# Patient Record
Sex: Female | Born: 1955 | Race: White | Hispanic: No | Marital: Single | Smoking: Never smoker
Health system: Southern US, Community
[De-identification: ages and names within clinical notes are randomized; demographics above are authoritative.]

## PROBLEM LIST (undated history)

## (undated) DIAGNOSIS — I10 Essential (primary) hypertension: Secondary | ICD-10-CM

## (undated) DIAGNOSIS — M810 Age-related osteoporosis without current pathological fracture: Secondary | ICD-10-CM

## (undated) DIAGNOSIS — T7840XA Allergy, unspecified, initial encounter: Secondary | ICD-10-CM

## (undated) DIAGNOSIS — T783XXA Angioneurotic edema, initial encounter: Secondary | ICD-10-CM

## (undated) DIAGNOSIS — IMO0002 Reserved for concepts with insufficient information to code with codable children: Secondary | ICD-10-CM

## (undated) DIAGNOSIS — M1990 Unspecified osteoarthritis, unspecified site: Secondary | ICD-10-CM

## (undated) DIAGNOSIS — M549 Dorsalgia, unspecified: Principal | ICD-10-CM

## (undated) DIAGNOSIS — E119 Type 2 diabetes mellitus without complications: Principal | ICD-10-CM

## (undated) DIAGNOSIS — G8929 Other chronic pain: Principal | ICD-10-CM

## (undated) HISTORY — PX: FRACTURE SURGERY: SHX138

## (undated) HISTORY — DX: Age-related osteoporosis without current pathological fracture: M81.0

## (undated) HISTORY — DX: Essential (primary) hypertension: I10

## (undated) HISTORY — DX: Dorsalgia, unspecified: M54.9

## (undated) HISTORY — PX: NOSE SURGERY: SHX723

## (undated) HISTORY — DX: Reserved for concepts with insufficient information to code with codable children: IMO0002

## (undated) HISTORY — DX: Allergy, unspecified, initial encounter: T78.40XA

## (undated) HISTORY — PX: CERVICAL SPINE SURGERY: SHX589

## (undated) HISTORY — PX: BACK SURGERY: SHX140

## (undated) HISTORY — DX: Angioneurotic edema, initial encounter: T78.3XXA

## (undated) HISTORY — PX: SPINE SURGERY: SHX786

## (undated) HISTORY — DX: Other chronic pain: G89.29

## (undated) HISTORY — DX: Unspecified osteoarthritis, unspecified site: M19.90

## (undated) HISTORY — DX: Type 2 diabetes mellitus without complications: E11.9

## (undated) HISTORY — PX: OVARIAN CYST SURGERY: SHX726

---

## 2004-04-25 ENCOUNTER — Emergency Department (HOSPITAL_COMMUNITY): Admission: EM | Admit: 2004-04-25 | Discharge: 2004-04-25 | Payer: Self-pay | Admitting: Emergency Medicine

## 2012-05-23 ENCOUNTER — Emergency Department: Payer: Self-pay | Admitting: Emergency Medicine

## 2012-08-07 ENCOUNTER — Ambulatory Visit: Payer: Self-pay | Admitting: Physician Assistant

## 2012-08-07 ENCOUNTER — Ambulatory Visit: Payer: Self-pay | Admitting: Family Medicine

## 2012-08-16 ENCOUNTER — Ambulatory Visit (INDEPENDENT_AMBULATORY_CARE_PROVIDER_SITE_OTHER): Payer: PRIVATE HEALTH INSURANCE | Admitting: Family Medicine

## 2012-08-16 ENCOUNTER — Ambulatory Visit
Admission: RE | Admit: 2012-08-16 | Discharge: 2012-08-16 | Disposition: A | Discharge status: Home / Self Care | Payer: PRIVATE HEALTH INSURANCE | Source: Ambulatory Visit | Attending: Family Medicine | Admitting: Family Medicine

## 2012-08-16 ENCOUNTER — Ambulatory Visit (INDEPENDENT_AMBULATORY_CARE_PROVIDER_SITE_OTHER)
Admission: RE | Admit: 2012-08-16 | Discharge: 2012-08-16 | Disposition: A | Discharge status: Home / Self Care | Payer: PRIVATE HEALTH INSURANCE | Source: Ambulatory Visit | Attending: Family Medicine | Admitting: Family Medicine

## 2012-08-16 ENCOUNTER — Encounter: Payer: Self-pay | Admitting: Family Medicine

## 2012-08-16 VITALS — BP 130/82 | HR 80 | Temp 98.4°F

## 2012-08-16 DIAGNOSIS — R209 Unspecified disturbances of skin sensation: Secondary | ICD-10-CM

## 2012-08-16 DIAGNOSIS — M79644 Pain in right finger(s): Secondary | ICD-10-CM

## 2012-08-16 DIAGNOSIS — M2550 Pain in unspecified joint: Secondary | ICD-10-CM

## 2012-08-16 DIAGNOSIS — M549 Dorsalgia, unspecified: Secondary | ICD-10-CM

## 2012-08-16 DIAGNOSIS — M79609 Pain in unspecified limb: Secondary | ICD-10-CM

## 2012-08-16 DIAGNOSIS — R200 Anesthesia of skin: Secondary | ICD-10-CM

## 2012-08-16 NOTE — Progress Notes (Signed)
Nature conservation officer at [facility] [address] [place] [address] Phone: [phone] Fax: [phone]  Date:  08/16/2012   Name:  Summer Brown   DOB:  06-28-1955   MRN:  [id number] Gender: female Age: 57 y.o.  Primary Physician:  Hannah Beat, MD  Evaluating MD: Hannah Beat, MD   Chief Complaint: Establish Care   History of Present Illness:  Summer Brown is a 57 y.o. pleasant patient who presents with the following:  New patient presents with multiple complaints including neck pain, hand swelling, polyarthralgia, R sided median nerve distribution numbness in the UE. She also has concern about potential fracture to the R 5th digit.   Fingers are swollen. No synovitis. She relates this to doing repetitive things at work.  R sided medial nerve distribution numbness She reports having had EMG/NCV that were normal > 20 years ago. 3 months ago, maybe 4, numbness started.  Back - she also describes a recent back injury at work, and she is being followed by her worker's comp provider. Most recently given flexeril and a medrol dose-pak. L > R toes - affected. Numbness.  Reports that slipped and fell   R 5th finger: trauma in the last week, now pain and swelling mostly along the shaft. No bruising.   B radiculopathy Legs: intermittent, no prior spine surgery.  Repetitive motion activities at work.  There are no active problems to display for this patient.   Past Medical History  Diagnosis Date  . Ulcer     No past surgical history on file.  History   Social History  . Marital Status: Divorced    Spouse Name: N/A    Number of Children: N/A  . Years of Education: N/A   Occupational History  .  Other    [employer]   Social History Main Topics  . Smoking status: Never Smoker   . Smokeless tobacco: Not on file  . Alcohol Use: Not on file  . Drug Use: Not on file  . Sexually Active: Not on file   Other Topics Concern  . 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  Allergies  Allergen Reactions  . Codeine Itching    No current outpatient prescriptions on file prior to visit.   No current facility-administered medications on file prior to visit.     Review of Systems:  Multiple neuro complaints as above, thinking clearly, no chest pain, shortness of breath, fever. MSK as above. Otherwise, the pertinent positives and negatives are listed above and in the HPI, otherwise a full review of systems has been reviewed and is negative unless noted positive.   Physical Examination: BP 130/82  Pulse 80  Temp(Src) 98.4 F (36.9 C) (Oral)  SpO2 96%  Ideal Body Weight:     GEN: Well-developed,well-nourished,in no acute distress; alert,appropriate and cooperative throughout examination HEENT: Normocephalic and atraumatic without obvious abnormalities. Ears, externally no deformities CV: rrr, no m/g/r PULM: Breathing comfortably in no respiratory distress EXT: No clubbing, cyanosis, or edema PSYCH: Normally interactive. Cooperative during the interview. Pleasant. Friendly and conversant. Not anxious or depressed appearing. Normal, full affect.  CERVICAL SPINE EXAM Range of motion: Flexion, extension, lateral bending, and rotation: fairly good, lacks 5 deg Pain with terminal motion: yes Spinous Processes: NT SCM: NT Upper paracervical muscles: mild tenderness Upper traps: mild tenderness C5-T1 intact, sensation and motor  GRIP REDUCED AT THE R COMPARED TO L  R hand Ecchymosis or edema: neg ROM wrist/hand/digits: restricted at 5 Carpals, MCP's, digits: no significant synovitis, mildly tender along tendon sheaths. 5th ttp between MCP and PIP on  shaft Distal Ulna and Radius: NT Ecchymosis or edema: neg No instability Cysts/nodules: neg Digit triggering: neg Finkelstein's test: neg Snuffbox tenderness: neg Scaphoid tubercle: NT Full  composite fist, no malrotation Grip, all digits: 5/5 str DIPJT: NT PIP JT: NT MCP JT: NT No tenosynovitis Axial load test: neg Phalen's and tinel's: baseline tingling and numb Atrophy: neg  Hand sensation: intact   Dg Finger Little Right  08/16/2012   *RADIOLOGY REPORT*  Clinical Data: right little finger pain, trauma  RIGHT LITTLE FINGER 2+V  Comparison: None.  Findings: No fracture or dislocation.  Soft tissue swelling around PIP joint.  IMPRESSION: Soft tissue swelling, no fracture.   Original Report Authenticated By: Esperanza Heir, M.D.    Assessment and Plan:  Polyarthralgia - Plan: ANA, Cyclic citrul peptide antibody, IgG, Rheumatoid factor, Sedimentation rate, High sensitivity CRP, CBC with Differential: potential systemic rheum disorder is possible here with multitude of MSK complaints.  Medrol-dosepak may make finger discomfort improve.  Finger pain, right - Plan: DG Finger Little Right, CANCELED: DG Finger Index Right: no fx, reassured.  Numbness and tingling in right hand - Plan: NCV with EMG(electromyography): nerve entrapment with decreased grip and median nerve distribution. Question if is true CTS or entrapped higher in arm or approaching neck.  Backache: defer to w/c provider.  Orders Today:  Orders Placed This Encounter  Procedures  . DG Finger Little Right    Standing Status: Future     Number of Occurrences: 1     Standing Expiration Date: 10/17/2013    Order Specific Question:  Reason for Exam (SYMPTOM  OR DIAGNOSIS REQUIRED)    Answer:  trauma    Order Specific Question:  Is the patient pregnant?    Answer:  No    Order Specific Question:  Preferred imaging location?    Answer:  [facility]  . ANA  . Cyclic citrul peptide antibody, IgG  . Rheumatoid factor  . Sedimentation rate  . High sensitivity CRP  . CBC with Differential  . Anti-nuclear ab-titer (ANA titer)  . NCV with EMG(electromyography)    Standing Status: Future     Number of  Occurrences:      Standing Expiration Date: 08/16/2013    Updated Medication List: (Includes new medications, updates to list, dose adjustments) Meds ordered this encounter  Medications  . cyclobenzaprine (FLEXERIL) 10 MG tablet    Sig: Take 10 mg by mouth 3 (three) times daily as needed for muscle spasms.  Marland Kitchen ibuprofen (ADVIL,MOTRIN) 600 MG tablet    Sig: Take 600 mg by mouth every 6 (six) hours as needed for pain.  . methocarbamol (ROBAXIN) 500 MG tablet    Sig: Take 1,000 mg by mouth 4 (four) times daily.    Medications Discontinued: There are no discontinued medications.    Signed, Elpidio Galea. Tniya Bowditch, MD 08/16/2012 2:24 PM

## 2012-08-16 NOTE — Patient Instructions (Addendum)
REFERRAL: GO THE THE FRONT ROOM AT THE ENTRANCE OF OUR CLINIC, NEAR CHECK IN. ASK FOR MARION. SHE WILL HELP YOU SET UP YOUR REFERRAL. DATE: TIME:  

## 2012-08-17 LAB — CBC WITH DIFFERENTIAL/PLATELET
Basophils Relative: 1.2 % (ref 0.0–3.0)
Eosinophils Relative: 4.8 % (ref 0.0–5.0)
Lymphocytes Relative: 35.9 % (ref 12.0–46.0)
Monocytes Relative: 8.1 % (ref 3.0–12.0)
Neutrophils Relative %: 50 % (ref 43.0–77.0)
Platelets: 239 10*3/uL (ref 150.0–400.0)
RBC: 4.43 Mil/uL (ref 3.87–5.11)
WBC: 7.1 10*3/uL (ref 4.5–10.5)

## 2012-08-17 LAB — ANTI-NUCLEAR AB-TITER (ANA TITER): ANA Titer 1: 1:320 {titer} — ABNORMAL HIGH

## 2012-08-17 LAB — ANA: Anti Nuclear Antibody(ANA): POSITIVE — AB

## 2012-08-17 LAB — HIGH SENSITIVITY CRP: CRP, High Sensitivity: 4.21 mg/L (ref 0.000–5.000)

## 2012-08-17 LAB — RHEUMATOID FACTOR: Rhuematoid fact SerPl-aCnc: <10 IU/mL (ref <=14)

## 2012-08-20 ENCOUNTER — Encounter: Payer: Self-pay | Admitting: Family Medicine

## 2012-08-23 ENCOUNTER — Encounter: Payer: Self-pay | Admitting: *Deleted

## 2012-09-21 ENCOUNTER — Ambulatory Visit: Payer: Self-pay | Admitting: Orthopedic Surgery

## 2012-10-02 ENCOUNTER — Telehealth: Payer: Self-pay

## 2012-10-02 NOTE — Telephone Encounter (Signed)
Pt said she had gotten letter several weeks ago about lab results and pt wants to know what she owes for labs. Pt will call [facility] for billing [phone]. Pt said she had recently had carpal tunnel surgery last week.

## 2012-10-03 ENCOUNTER — Telehealth: Payer: Self-pay

## 2012-10-03 NOTE — Telephone Encounter (Signed)
Pt received lab results with Dr Copland's comments in the mail; pt request more detail about the one test that was not normal and what Dr Patsy Lager was looking for in the labs that were ordered. Pt request cb on 10/04/12.

## 2012-10-04 NOTE — Telephone Encounter (Signed)
Discussed all with her.

## 2012-10-13 ENCOUNTER — Telehealth: Payer: Self-pay

## 2012-10-13 NOTE — Telephone Encounter (Signed)
Summer Brown with [facility] cking status of med records request faxed 10/04/12. Advised [facility] picked up on 10/10/12; once picked up has 7-10 day response time.

## 2012-10-25 ENCOUNTER — Telehealth: Payer: Self-pay

## 2012-10-25 DIAGNOSIS — M5416 Radiculopathy, lumbar region: Secondary | ICD-10-CM

## 2012-10-25 NOTE — Telephone Encounter (Signed)
Pt was seen 08/16/2012; pt still having problems with numbness in rt leg and on and off has sharpe pain in rt leg. Pt had worker comp injury in 07/2012 and has seen orthopedic who did MRI (pt has MRI disc). Pt wants referral to neurologist because ortho has no idea why pt has numbness and pain in rt leg. Pt does not want shot in her back. Pt spoke with Efraim Kaufmann, workers comp rep and pt was advised can get 2nd opinion herself or worker's comp can schedule. Pt request our office do a neurology referral. Pt's workers comp rep Gaffer # is [phone]. Pt request cb.

## 2012-10-25 NOTE — Telephone Encounter (Signed)
I would suggest that she a neurosurgeon who specializes in spine surgery and evaluation. They are the experts in that field.  I will put in consult for one of the best in our area.    Hannah Beat, MD 10/25/2012, 5:35 PM

## 2012-10-26 ENCOUNTER — Telehealth: Payer: Self-pay | Admitting: Family Medicine

## 2012-10-26 NOTE — Telephone Encounter (Signed)
Patient notified as instructed.  Advised Shirlee Limerick or Bonita Quin would be calling her with the appointment. Neysa states the referral will need to be called to her worker's comp rep Melissa at [phone].

## 2012-10-26 NOTE — Telephone Encounter (Signed)
Called [facility] and spoke to their [facility] person, Tacey Ruiz. She told me what had to happen to get the pt seen there. They needed the Ortho notes and MRI before accepting her and approval from [facility]. After sending your note over and referral I called  the pt to let her know. She called the [facility] person and they told her that she could Not see a Neurosurgeon that they were sending her to another Ortho in [place]. She asked [place] to stop this referral at this time and would call back if she wanted it again. I am cancelling this referral.

## 2012-10-27 NOTE — Telephone Encounter (Signed)
OK 

## 2013-06-28 ENCOUNTER — Ambulatory Visit (INDEPENDENT_AMBULATORY_CARE_PROVIDER_SITE_OTHER): Payer: Managed Care, Other (non HMO) | Admitting: Family Medicine

## 2013-06-28 ENCOUNTER — Other Ambulatory Visit (HOSPITAL_COMMUNITY)
Admission: RE | Admit: 2013-06-28 | Discharge: 2013-06-28 | Disposition: A | Discharge status: Home / Self Care | Payer: Managed Care, Other (non HMO) | Source: Ambulatory Visit | Attending: Family Medicine | Admitting: Family Medicine

## 2013-06-28 ENCOUNTER — Encounter: Payer: Self-pay | Admitting: Family Medicine

## 2013-06-28 VITALS — BP 130/84 | HR 86 | Temp 98.4°F | Ht 67.5 in | Wt 296.8 lb

## 2013-06-28 DIAGNOSIS — Z1151 Encounter for screening for human papillomavirus (HPV): Secondary | ICD-10-CM | POA: Insufficient documentation

## 2013-06-28 DIAGNOSIS — Z124 Encounter for screening for malignant neoplasm of cervix: Secondary | ICD-10-CM

## 2013-06-28 DIAGNOSIS — R635 Abnormal weight gain: Secondary | ICD-10-CM

## 2013-06-28 DIAGNOSIS — Z0000 Encounter for general adult medical examination without abnormal findings: Secondary | ICD-10-CM

## 2013-06-28 DIAGNOSIS — R5381 Other malaise: Secondary | ICD-10-CM

## 2013-06-28 DIAGNOSIS — R5383 Other fatigue: Secondary | ICD-10-CM

## 2013-06-28 DIAGNOSIS — Z01419 Encounter for gynecological examination (general) (routine) without abnormal findings: Secondary | ICD-10-CM | POA: Insufficient documentation

## 2013-06-28 DIAGNOSIS — M2550 Pain in unspecified joint: Secondary | ICD-10-CM

## 2013-06-28 NOTE — Progress Notes (Signed)
[place] [address] Phone: [phone] Fax: [phone]  Patient ID: Summer Brown MRN: [id number], DOB: [birth year], 58 y.o. Date of Encounter: 06/28/2013  Primary Physician:  Owens Loffler, MD   Chief Complaint: Annual Exam  Subjective:   History of Present Illness:  Summer Brown is a 58 y.o. pleasant patient who presents with the following:  Health Maintenance Summary Reviewed and updated, unless pt declines services and some acute concerns.  Tobacco History Reviewed. Non-smoker Alcohol: No concerns, no excessive use Exercise Habits: minimal to no activity STD concerns: none Drug Use: None Birth control method: n/a Menses regular: n/a Lumps or breast concerns: no Breast Cancer Family History: no  Mammo: she will set up. Colon: declines  Having some urinary leakage.  Weight gain.  Stopped period about a year ago.   Almost no alcohol.  Restrictions from back injury.   He fatigue and generally not feeling a little well. The patient reports she has had about 100 pounds of weight gain compared to her last pregnancy weight. She is very upset about this, she thinks that this may be related to some hormonal dysfunction, and she is minimally able to exercise right now due to some restrictions with her back. She thinks that she is following a relatively healthy diet, but she is still unable to lose weight in any way.   Health Maintenance  Topic Date Due  . Mammogram  07/24/2004  . Colonoscopy  07/01/2018 (Originally 07/24/2004)  . Tetanus/tdap  07/01/2018 (Originally 07/24/1973)  . Influenza Vaccine  08/18/2013  . Pap Smear  06/28/2016      There is no immunization history on file for this patient.  There are no active problems to display for this patient.  Past Medical History  Diagnosis Date  . Ulcer    No past surgical history on file. History   Social History  . Marital Status: Divorced    Spouse Name: N/A    Number of Children:  N/A  . Years of Education: N/A   Occupational History  .  Other    [employer]   Social History Main Topics  . Smoking status: Never Smoker   . Smokeless tobacco: Never Used  . Alcohol Use: Yes     Comment: rare  . Drug Use: No  . Sexual Activity: Not on file   Other Topics Concern  . 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 Allergies  Allergen Reactions  . Codeine Itching    Medication list has been reviewed and updated.  Review of Systems:   General: Denies fever, chills, sweats. No significant weight loss. Eyes: Denies blurring,significant itching ENT: Denies earache, sore throat, and hoarseness.  Cardiovascular: Denies chest pains, palpitations, dyspnea on exertion,  Respiratory: Denies cough, dyspnea at rest,wheeezing Breast: no concerns about lumps GI: Denies nausea, vomiting, diarrhea, constipation, change in bowel habits, abdominal pain, melena, hematochezia GU: Denies dysuria, hematuria, urinary hesitancy, nocturia, denies STD risk, no concerns about discharge Musculoskeletal: ONGOING BACK PAIN. Multiple interventions including epidural steroid injections and other procedures or treatments have been tried. She is being seen and followed by neurosurgery. They have recommended at this point consideration of operative fixation. Derm: Denies rash, itching Neuro: Denies  paresthesias, frequent falls, frequent headaches Psych: Denies depression, anxiety Endocrine: Denies cold intolerance, heat intolerance, polydipsia Heme: Denies enlarged lymph nodes Allergy: No hayfever  Objective:  Physical Examination: BP 130/84  Pulse 86  Temp(Src) 98.4 F (36.9 C) (Oral)  Ht 5' 7.5" (1.715 m)  Wt 296 lb 12 oz (134.605 kg)  BMI 45.76 kg/m2  No exam data present GEN: well developed, well nourished, no acute distress Eyes: conjunctiva and lids normal, PERRLA, EOMI ENT: TM clear, nares clear, oral  exam WNL Neck: supple, no lymphadenopathy, no thyromegaly, no JVD Pulm: clear to auscultation and percussion, respiratory effort normal CV: regular rate and rhythm, S1-S2, no murmur, rub or gallop, no bruits Chest: no scars, masses, no lumps BREAST: no lumps, no axillary LAD, no nipple discharge GI: soft, non-tender; no hepatosplenomegaly, masses; active bowel sounds all quadrants GU: Normal external female genitalia. Cervix appears intact without lesions or irritation. Vaginal canal normal without ulceration or lesion. Cervix NT to exam. Ovaries neither enlarged nor tender. (Chaperoned examination by female staff) This portion of the physical examination was chaperoned by Hedy Camara, [place].  Lymph: no cervical, axillary or inguinal adenopathy MSK: gait normal. SKIN: clear, good turgor, color WNL, no rashes, lesions, or ulcerations Neuro: normal mental status, normal strength, sensation, and motion Psych: alert; oriented to person, place and time, normally interactive and not anxious or depressed in appearance.  All labs reviewed with patient. Post-visit. Lipids: No results found for this basename: chol, trig, hdl, ldl, ldldirect, vldl, cholhdl   CBC: CBC Latest Ref Rng 06/28/2013 08/16/2012  WBC 4.0 - 10.5 K/uL 7.6 7.1  Hemoglobin 12.0 - 15.0 g/dL 15.1(H) 14.2  Hematocrit 36.0 - 46.0 % 44.7 41.4  Platelets 150.0 - 400.0 K/uL 286.0 037.0    Basic Metabolic Panel:    Component Value Date/Time   NA 138 06/28/2013 1554   K 4.0 06/28/2013 1554   CL 103 06/28/2013 1554   CO2 26 06/28/2013 1554   BUN 16 06/28/2013 1554   CREATININE 0.7 06/28/2013 1554   GLUCOSE 101* 06/28/2013 1554   CALCIUM 9.4 06/28/2013 1554   Hepatic Function Latest Ref Rng 06/28/2013  Total Protein 6.0 - 8.3 g/dL 7.5  Albumin 3.5 - 5.2 g/dL 4.0  AST 0 - 37 U/L 23  ALT 0 - 35 U/L 28  Alk Phosphatase 39 - 117 U/L 58  Total Bilirubin 0.2 - 1.2 mg/dL 0.4  Bilirubin, Direct 0.0 - 0.3 mg/dL 0.1    Lab Results    Component Value Date   TSH 2.27 06/28/2013    No results found.  Assessment & Plan:   Routine general medical examination at a health care facility  Other malaise and fatigue - Plan: Basic metabolic panel, CBC with Differential, Hepatic function panel, TSH  Weight gain - Plan: TSH  Screening for malignant neoplasm of the cervix - Plan: Cytology - PAP [place]  Health Maintenance Exam: The patient's preventative maintenance and recommended screening tests for an annual wellness exam were reviewed in full today. Brought up to date unless services declined.  Counselled on the importance of diet, exercise, and its role in overall health and mortality. The patient's FH and SH was reviewed, including their home life, tobacco status, and drug and alcohol status.  Refuses colonoscopy. The patient declines routine health maintenance services noted. We reviewed that could lead to missing significant problems that could affect there mortality. The patient indicated that they understood this and was willing to accept those risks.   Weight gain and fatigue: >15 minutes spent in face to face time with patient, >50% spent in counselling or coordination of care: we spent 100 percent of this  time discussing diet, exercise, weight loss, plans dictated influence weight loss, or runs it may influence weight loss, and tied to diet she could be followed. We also discussed some potential medications, but I did not think that there are appropriate in his case. It all of these things, potential benefits has to be weighed versus potential risks. The patient expressed ongoing frustration with her inability to lose weight and not doing all that well.  Follow-up: No Follow-up on file. Or follow-up in 1 year for complete physical examination  New Prescriptions   No medications on file   Modified Medications   No medications on file   Orders Placed This Encounter  Procedures  . Basic metabolic panel  .  CBC with Differential  . Hepatic function panel  . TSH    Signed,  Frederico Hamman T. Vian Fluegel, MD, CAQ Sports Medicine   Discontinued Medications   CYCLOBENZAPRINE (FLEXERIL) 10 MG TABLET    Take 10 mg by mouth 3 (three) times daily as needed for muscle spasms.   METHOCARBAMOL (ROBAXIN) 500 MG TABLET    Take 1,000 mg by mouth 4 (four) times daily.   Current Medications at Discharge:   Medication List       This list is accurate as of: 06/28/13 11:59 PM.  Always use your most recent med list.               ibuprofen 600 MG tablet  Commonly known as:  ADVIL,MOTRIN  Take 600 mg by mouth every 6 (six) hours as needed for pain.

## 2013-06-28 NOTE — Progress Notes (Signed)
Pre visit review using our clinic review tool, if applicable. No additional management support is needed unless otherwise documented below in the visit note. 

## 2013-06-29 ENCOUNTER — Encounter: Payer: Self-pay | Admitting: *Deleted

## 2013-06-29 LAB — BASIC METABOLIC PANEL
BUN: 16 mg/dL (ref 6–23)
CALCIUM: 9.4 mg/dL (ref 8.4–10.5)
CO2: 26 meq/L (ref 19–32)
Chloride: 103 mEq/L (ref 96–112)
Creatinine, Ser: 0.7 mg/dL (ref 0.4–1.2)
GFR: 88.14 mL/min (ref >60.00)
GLUCOSE: 101 mg/dL — AB (ref 70–99)
Potassium: 4 mEq/L (ref 3.5–5.1)
Sodium: 138 mEq/L (ref 135–145)

## 2013-06-29 LAB — CBC WITH DIFFERENTIAL/PLATELET
BASOS ABS: 0.1 10*3/uL (ref 0.0–0.1)
Basophils Relative: 1.2 % (ref 0.0–3.0)
EOS ABS: 0.2 10*3/uL (ref 0.0–0.7)
Eosinophils Relative: 2.7 % (ref 0.0–5.0)
HCT: 44.7 % (ref 36.0–46.0)
Hemoglobin: 15.1 g/dL — ABNORMAL HIGH (ref 12.0–15.0)
LYMPHS ABS: 2.8 10*3/uL (ref 0.7–4.0)
LYMPHS PCT: 37.2 % (ref 12.0–46.0)
MCHC: 33.9 g/dL (ref 30.0–36.0)
MCV: 91.9 fl (ref 78.0–100.0)
MONOS PCT: 9.2 % (ref 3.0–12.0)
Monocytes Absolute: 0.7 10*3/uL (ref 0.1–1.0)
Neutro Abs: 3.8 10*3/uL (ref 1.4–7.7)
Neutrophils Relative %: 49.7 % (ref 43.0–77.0)
PLATELETS: 286 10*3/uL (ref 150.0–400.0)
RBC: 4.87 Mil/uL (ref 3.87–5.11)
RDW: 13.6 % (ref 11.5–15.5)
WBC: 7.6 10*3/uL (ref 4.0–10.5)

## 2013-06-29 LAB — HEPATIC FUNCTION PANEL
ALK PHOS: 58 U/L (ref 39–117)
ALT: 28 U/L (ref 0–35)
AST: 23 U/L (ref 0–37)
Albumin: 4 g/dL (ref 3.5–5.2)
BILIRUBIN TOTAL: 0.4 mg/dL (ref 0.2–1.2)
Bilirubin, Direct: 0.1 mg/dL (ref 0.0–0.3)
Total Protein: 7.5 g/dL (ref 6.0–8.3)

## 2013-06-29 LAB — TSH: TSH: 2.27 u[IU]/mL (ref 0.35–4.50)

## 2013-07-02 ENCOUNTER — Other Ambulatory Visit (INDEPENDENT_AMBULATORY_CARE_PROVIDER_SITE_OTHER): Payer: Managed Care, Other (non HMO)

## 2013-07-02 DIAGNOSIS — Z13220 Encounter for screening for lipoid disorders: Secondary | ICD-10-CM

## 2013-07-02 LAB — LIPID PANEL
CHOL/HDL RATIO: 4
Cholesterol: 166 mg/dL (ref 0–200)
HDL: 41.2 mg/dL (ref >39.00)
LDL Cholesterol: 109 mg/dL — ABNORMAL HIGH (ref 0–99)
NONHDL: 124.8
Triglycerides: 78 mg/dL (ref 0.0–149.0)
VLDL: 15.6 mg/dL (ref 0.0–40.0)

## 2013-07-03 ENCOUNTER — Encounter: Payer: Self-pay | Admitting: *Deleted

## 2013-07-03 LAB — CYTOLOGY - PAP

## 2013-07-04 ENCOUNTER — Telehealth: Payer: Self-pay

## 2013-07-04 ENCOUNTER — Encounter: Payer: Self-pay | Admitting: *Deleted

## 2013-07-04 NOTE — Telephone Encounter (Signed)
Call:  Please read what I said last year.  I did not repeat rheumatological labs again this year - this is not done typically with routine health maintenance. Electronically Signed  By: Owens Loffler, MD On: 07/04/2013 5:03 PM    "Notes Recorded by Ricki Miller, CMA on 08/23/2012 at 9:48 AM Mailed letter and copy of labs. ------  Notes Recorded by Owens Loffler, MD on 08/22/2012 at 8:32 PM Letter and copy of labs:  Basically, all labs look ok. 1 of them came back abnormal, but when looking at the overall picture with 2 other important markers, it would be highly, highly unusual for this to go along with systemic rheumatological problems.  That does not mean that you do not have significant symptoms and pain - because you do. Hopefully, some of the treatment for your back will help other joints.   Owens Loffler, MD 08/22/2012, 8:32 PM"

## 2013-07-04 NOTE — Telephone Encounter (Signed)
Pt said last year Dr Lorelei Pont mentioned one of labs indicated possible lupus and pt wants to know if testing this year indicated any problem with lupus.pt request cb.

## 2013-07-09 DIAGNOSIS — M2550 Pain in unspecified joint: Secondary | ICD-10-CM | POA: Insufficient documentation

## 2013-07-09 NOTE — Telephone Encounter (Signed)
Please set up draw. Extensive future orders placed for the patient.   Thanks!  Electronically Signed  By: Owens Loffler, MD On: 07/09/2013 6:01 PM

## 2013-07-09 NOTE — Addendum Note (Signed)
Addended by: Owens Loffler on: 07/09/2013 06:01 PM   Modules accepted: Orders

## 2013-07-09 NOTE — Telephone Encounter (Signed)
Spoke with Summer Brown and advised Dr. Lorelei Pont did not repeat the rheumatology labs because those are not normal labs ordered for routine health maintenance and according to last years note that Dr. Lorelei Pont felt is was highly, highly unusual for this to be a systemic rheumatological problem. Patient states she would feel better if Dr. Lorelei Pont would repeat the labs so if something is wrong it can be followed up on.  Advised I would send a note to Dr. Lorelei Pont.

## 2013-07-10 NOTE — Telephone Encounter (Signed)
Patient scheduled.

## 2013-07-11 ENCOUNTER — Other Ambulatory Visit (INDEPENDENT_AMBULATORY_CARE_PROVIDER_SITE_OTHER): Payer: Managed Care, Other (non HMO)

## 2013-07-11 ENCOUNTER — Other Ambulatory Visit: Payer: Self-pay | Admitting: Family Medicine

## 2013-07-11 DIAGNOSIS — R5383 Other fatigue: Principal | ICD-10-CM

## 2013-07-11 DIAGNOSIS — R5381 Other malaise: Secondary | ICD-10-CM

## 2013-07-11 DIAGNOSIS — M2550 Pain in unspecified joint: Secondary | ICD-10-CM

## 2013-07-11 DIAGNOSIS — R635 Abnormal weight gain: Secondary | ICD-10-CM

## 2013-07-11 LAB — HIGH SENSITIVITY CRP: CRP, High Sensitivity: 4.61 mg/L (ref 0.000–5.000)

## 2013-07-11 LAB — SEDIMENTATION RATE: SED RATE: 6 mm/h (ref 0–22)

## 2013-07-11 LAB — URIC ACID: Uric Acid, Serum: 5.9 mg/dL (ref 2.4–7.0)

## 2013-07-12 LAB — SYSTEMIC LUPUS PANEL-COMPREHENSIVE
ENA SM Ab Ser-aCnc: <1
Jo-1 Antibody, IgG: <1
SCLERODERMA (SCL-70) (ENA) ANTIBODY, IGG: NEGATIVE
SM/RNP: NEGATIVE
SSA (RO) (ENA) ANTIBODY, IGG: NEGATIVE
SSB (La) (ENA) Antibody, IgG: <1

## 2013-07-12 LAB — CYCLIC CITRUL PEPTIDE ANTIBODY, IGG

## 2013-07-13 ENCOUNTER — Encounter: Payer: Self-pay | Admitting: *Deleted

## 2013-07-13 LAB — ANTI-NUCLEAR AB-TITER (ANA TITER)

## 2013-07-13 LAB — ANA: Anti Nuclear Antibody(ANA): POSITIVE — AB

## 2013-12-08 ENCOUNTER — Emergency Department: Payer: Self-pay | Admitting: Emergency Medicine

## 2013-12-08 LAB — BASIC METABOLIC PANEL
Anion Gap: 4 — ABNORMAL LOW (ref 7–16)
BUN: 18 mg/dL (ref 7–18)
CREATININE: 0.87 mg/dL (ref 0.60–1.30)
Calcium, Total: 8.4 mg/dL — ABNORMAL LOW (ref 8.5–10.1)
Chloride: 105 mmol/L (ref 98–107)
Co2: 30 mmol/L (ref 21–32)
EGFR (African American): >60
EGFR (Non-African Amer.): >60
Glucose: 110 mg/dL — ABNORMAL HIGH (ref 65–99)
Osmolality: 280 (ref 275–301)
POTASSIUM: 3.7 mmol/L (ref 3.5–5.1)
Sodium: 139 mmol/L (ref 136–145)

## 2013-12-08 LAB — URINALYSIS, COMPLETE
Bilirubin,UR: NEGATIVE
Blood: NEGATIVE
Glucose,UR: NEGATIVE mg/dL (ref 0–75)
Ketone: NEGATIVE
LEUKOCYTE ESTERASE: NEGATIVE
NITRITE: NEGATIVE
PROTEIN: NEGATIVE
Ph: 5 (ref 4.5–8.0)
RBC,UR: NONE SEEN /HPF (ref 0–5)
Specific Gravity: 1.011 (ref 1.003–1.030)
Squamous Epithelial: 1

## 2013-12-08 LAB — CBC
HCT: 45.6 % (ref 35.0–47.0)
HGB: 15.3 g/dL (ref 12.0–16.0)
MCH: 30.8 pg (ref 26.0–34.0)
MCHC: 33.5 g/dL (ref 32.0–36.0)
MCV: 92 fL (ref 80–100)
Platelet: 264 10*3/uL (ref 150–440)
RBC: 4.97 10*6/uL (ref 3.80–5.20)
RDW: 13.6 % (ref 11.5–14.5)
WBC: 9 10*3/uL (ref 3.6–11.0)

## 2013-12-12 ENCOUNTER — Ambulatory Visit (INDEPENDENT_AMBULATORY_CARE_PROVIDER_SITE_OTHER): Payer: Managed Care, Other (non HMO) | Admitting: Family Medicine

## 2013-12-12 ENCOUNTER — Ambulatory Visit: Payer: Self-pay | Admitting: Family Medicine

## 2013-12-12 ENCOUNTER — Encounter: Payer: Self-pay | Admitting: Family Medicine

## 2013-12-12 VITALS — BP 128/84 | HR 97 | Temp 98.2°F | Ht 67.5 in | Wt 306.8 lb

## 2013-12-12 DIAGNOSIS — R32 Unspecified urinary incontinence: Secondary | ICD-10-CM

## 2013-12-12 DIAGNOSIS — M549 Dorsalgia, unspecified: Secondary | ICD-10-CM

## 2013-12-12 DIAGNOSIS — G8929 Other chronic pain: Secondary | ICD-10-CM

## 2013-12-12 DIAGNOSIS — N39498 Other specified urinary incontinence: Secondary | ICD-10-CM

## 2013-12-12 DIAGNOSIS — R159 Full incontinence of feces: Secondary | ICD-10-CM

## 2013-12-12 HISTORY — DX: Other chronic pain: G89.29

## 2013-12-12 MED ORDER — PREDNISONE 20 MG PO TABS: 40.0000 mg | ORAL_TABLET | Freq: Every day | ORAL | Status: DC

## 2013-12-12 NOTE — Patient Instructions (Signed)

## 2013-12-12 NOTE — Progress Notes (Signed)
Dr. Frederico Hamman T. Sajid Ruppert, MD, [facility] [place], [address] Phone: [phone] Fax: [phone]  12/12/2013  Patient: Summer Brown, MRN: [id number], DOB: Jun 21, 1955, 58 y.o.  Primary Physician:  Owens Loffler, MD  Chief Complaint: Follow-up   Subjective:   Summer Brown is a 58 y.o. very pleasant female patient who presents with the following:  I have seen this patient twice, once for her new patient evaluation in July 2014, And I did a general physical examination in June 2015.  12/08/2013 ER discharge from [facility] for back pain with some incontinence.   Back pain, 07/2012. Ongoing worker comp case.   I essentially have not been involved with this care.  She reports to me that she has been seeing Dr. Patrice Paradise, the orthopedic spine surgeon, and she has also had somewhere on the order of 6-7 epidural steroid injections.  She reports she has had 1 set of lumbar spine x-rays and also one lumbar magnetic resonance imaging.  I do not have any of these records available.  This is all been under the direction of her Worker's Comp. provider, and she notes a prior injury at work.    She tells me that she went to the emergency room on December 08, 2013, when Dr. Patrice Paradise and was unable to see her in his office.  She reports having had an escalating amount of back pain over the last few weeks.  She reports that he has told her that she would benefit from spine surgery.  She reported to me in June 2015 at her complete examination that she was having some issues with occasional urine leakage.  Per her report, over the last couple of weeks she has had dramatically more urine incontinence, and she has started to wear a pad.  She occasionally has been fully incontinent.  She also states that she has had one episode of stool incontinence.  She is still having some pain going down the RIGHT side of her leg and she also has some numbness in her  foot on the RIGHT side.  She has not had any type of new recent trauma.  Per her report, all of her symptoms occurred after her initial accident.   She is very frustrated.  She seems quite upset today, and she is frustrated that she has not improved at all and that she seems to have gotten worse in the short interval.  While she was in the emergency room, she did have a perfectly clean urinalysis.  I reviewed all of the ER records.  Medial r foot r great ttoe weak  Past Medical History, Surgical History, Social History, Family History, Problem List, Medications, and Allergies have been reviewed and updated if relevant.  Patient Active Problem List   Diagnosis Date Noted  . Chronic back pain 12/12/2013  . Polyarthralgia 07/09/2013    Past Medical History  Diagnosis Date  . Ulcer   . Chronic back pain 12/12/2013    No past surgical history on file.  History   Social History  . Marital Status: Single    Spouse Name: N/A    Number of Children: N/A  . Years of Education: N/A   Occupational History  .  Other    [employer]   Social History Main Topics  . Smoking status: Never Smoker   . Smokeless tobacco: Never Used  . Alcohol Use: Yes     Comment: rare  .  Drug Use: No  . Sexual Activity: Not on file   Other Topics Concern  . 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  Allergies  Allergen Reactions  . Codeine Itching    Medication list reviewed and updated in full in [place].   GEN: no acute illness or fever CV: No chest pain or shortness of breath MSK: detailed above Neuro: as above Otherwise, the pertinent positives and negatives are listed above and in the HPI, otherwise a full review of systems has been reviewed and is negative unless noted positive.   Objective:   BP 128/84 mmHg  Pulse 97  Temp(Src) 98.2 F (36.8 C) (Oral)  Ht 5' 7.5" (1.715 m)  Wt 306 lb 12 oz  (139.141 kg)  BMI 47.31 kg/m2   GEN: Well-developed,well-nourished,in no acute distress; alert,appropriate and cooperative throughout examination HEENT: Normocephalic and atraumatic without obvious abnormalities. Ears, externally no deformities PULM: Breathing comfortably in no respiratory distress EXT: No clubbing, cyanosis, or edema PSYCH: Labile affect. Cooperative during the interview. Anxious appearing.  Range of motion at  the waist: Flexion, extension, lateral bending and rotation: Approaching full flexion at the waist, well beyond 90.  Extension does cause some pain, but approaching normal.  Lateral bending is approximately 25 percent decrease compared to what would be expected, and rotational movements cause some pain in her approximately 25-30 percent less compared to what would be expected.  No echymosis or edema Rises to examination table with mild difficulty Gait: minimally antalgic  Inspection/Deformity: N Paraspinus Tenderness: The patient is extensively tender from essentially L1-S1 throughout the lower back including the central portion, adjacent to the spinous processes, as well as the entirety of the paraspinous region bilaterally.  She is tender even to barely touching the surface.  B Ankle Dorsiflexion (L5,4): 5/5 B Great Toe Dorsiflexion (L5,4): 4-/5 on the RIGHT Heel Walk (L5): WNL Toe Walk (S1): WNL Rise/Squat (L4): WNL, mild pain  SENSORY B Medial Foot (L4): decreased on the Right B Dorsum (L5): WNL B Lateral (S1): WNL Light Touch: decreased on R medial foot Pinprick: decreased on R medial foot  REFLEXES Knee (L4): 2+ Ankle (S1): 2+  B SLR, seated: neg B SLR, supine: neg B FABER: neg B Reverse FABER: neg B Greater Troch: NT B Log Roll: neg B Sciatic Notch: mild TTP  Radiology: pending  Assessment and Plan:   Chronic back pain - Plan: DG Lumbar Spine Complete, MR Lumbar Spine Wo Contrast  Other urinary incontinence - Plan: DG Lumbar Spine  Complete, MR Lumbar Spine Wo Contrast  Bowel and bladder incontinence - Plan: DG Lumbar Spine Complete, MR Lumbar Spine Wo Contrast  >40 minutes spent in face to face time with patient, >50% spent in counselling or coordination of care:  Very complex case.  The patient was quite upset during our office visit today, and additional time was spent handling this.  Specifically, she is quite upset with her ongoing pain in her back, as well as some difficulty she has had with the [employer] system per her report.  I additionally tried to go over some basic things about incontinence in women as they age.  Based primarily on her presentation today with dramatically worsening symptoms per her report over the last several weeks and a newfound significantly worsening urine incontinence and one episode of incontinence of bowel, recommend obtaining an magnetic resonance imaging  of the lumbar spine without contrast to evaluate for potential cauda equina, cord edema, or acute cord compromise different from her prior state.  Attempted to get plain films today, but the patient is too heavy for our equipment, so these will be obtained at an outside imaging center or hospital prior to her magnetic resonance imaging.  Strongly recommended to the patient that she have her orthopedic spine surgeon or neurosurgeon be involved in the management of this case.  7 days of prednisone to hopefully calm this down acutely and bring her some pain relief now.  If no urgent neurosurgical condition is found, recommend urodynamic testing by urology.  New Prescriptions   PREDNISONE (DELTASONE) 20 MG TABLET    Take 2 tablets (40 mg total) by mouth daily with breakfast.   Orders Placed This Encounter  Procedures  . DG Lumbar Spine Complete  . MR Lumbar Spine Wo Contrast    Signed,  Talor Desrosiers T. Errick Salts, MD   Patient's Medications  New Prescriptions   PREDNISONE (DELTASONE) 20 MG TABLET    Take 2 tablets (40 mg total) by  mouth daily with breakfast.  Previous Medications   CYCLOBENZAPRINE (FLEXERIL) 10 MG TABLET    Take 10 mg by mouth 3 (three) times daily as needed for muscle spasms.   IBUPROFEN (ADVIL,MOTRIN) 600 MG TABLET    Take 600 mg by mouth every 6 (six) hours as needed for pain.   NAPROXEN SODIUM (ALEVE) 220 MG TABLET    Take 220 mg by mouth 2 (two) times daily with a meal.  Modified Medications   No medications on file  Discontinued Medications   No medications on file

## 2013-12-12 NOTE — Progress Notes (Signed)
Pre visit review using our clinic review tool, if applicable. No additional management support is needed unless otherwise documented below in the visit note. 

## 2013-12-14 ENCOUNTER — Telehealth: Payer: Self-pay | Admitting: *Deleted

## 2013-12-14 NOTE — Telephone Encounter (Signed)
She can also get a CD from [facility].  Our interpretation of images and exam findings are reported based on objective findings.    Electronically Signed  By: Owens Loffler, MD On: 12/14/2013 12:48 PM

## 2013-12-14 NOTE — Telephone Encounter (Signed)
Dr. Lorelei Pont gave me pt's xray results and told me to advise her it said she has arthritis and DDD, but nothing acute, and we are working with her worker comp to try to get her MRI approved.  Called pt and advise her of results, she kept telling me that Dr. Lorelei Pont needs to be careful on how he words the results because her insurance may not pay for her MRI, I advise pt that Dr. Lorelei Pont didn't read her xray that a radiologist did and this is what they said were her results, and not Dr. Lorelei Pont. Pt requested the images from the xray and I advise her we don't have the images only the report but she could have a copy of the report if she would like. Copy of report placed at the front for pt to pick-up

## 2013-12-14 NOTE — Telephone Encounter (Signed)
Pt was given [facility] phone # and advise she can request the images from them

## 2013-12-17 ENCOUNTER — Encounter: Payer: Self-pay | Admitting: Family Medicine

## 2013-12-24 ENCOUNTER — Telehealth: Payer: Self-pay | Admitting: Family Medicine

## 2013-12-24 NOTE — Addendum Note (Signed)
Addended by: Owens Loffler on: 12/24/2013 01:29 PM   Modules accepted: Orders

## 2013-12-24 NOTE — Telephone Encounter (Signed)
All noted and done. This is a reasonable course of action.

## 2013-12-24 NOTE — Telephone Encounter (Signed)
I called Dr Lorre Nick office to ask about the [facility] information. I was told that the patient has a FU appt with Dr Patrice Paradise on 12/26/13 and that their office on 12/12/13 has already sent in a request to get an MRI LS approved for this patient and they are just waiting for the approval. They said you should just cancel the MRI as they are already in the process of getting their's approved.Please cancel the MRI order in Epic.

## 2013-12-25 ENCOUNTER — Other Ambulatory Visit: Payer: Self-pay | Admitting: Family Medicine

## 2013-12-25 ENCOUNTER — Telehealth: Payer: Self-pay | Admitting: Family Medicine

## 2013-12-25 DIAGNOSIS — R32 Unspecified urinary incontinence: Secondary | ICD-10-CM

## 2013-12-25 NOTE — Telephone Encounter (Signed)
Pt called and needs referral to urologist per her insurance co.  She would like to go to [facility] [place].  Pt call back [phone]

## 2013-12-25 NOTE — Telephone Encounter (Signed)
done

## 2014-02-11 ENCOUNTER — Other Ambulatory Visit: Payer: Self-pay | Admitting: Orthopaedic Surgery

## 2014-02-11 DIAGNOSIS — M545 Low back pain, unspecified: Secondary | ICD-10-CM

## 2014-02-11 DIAGNOSIS — G8929 Other chronic pain: Secondary | ICD-10-CM

## 2014-02-17 ENCOUNTER — Ambulatory Visit
Admission: RE | Admit: 2014-02-17 | Discharge: 2014-02-17 | Disposition: A | Discharge status: Home / Self Care | Payer: Managed Care, Other (non HMO) | Source: Ambulatory Visit | Attending: Orthopaedic Surgery | Admitting: Orthopaedic Surgery

## 2014-02-17 DIAGNOSIS — M545 Low back pain, unspecified: Secondary | ICD-10-CM

## 2014-02-17 DIAGNOSIS — G8929 Other chronic pain: Secondary | ICD-10-CM

## 2014-04-25 IMAGING — CR DG FINGER LITTLE 2+V*R*
1 series · 1 of 1 positions shown · non-contrast
Comparison: None.

CLINICAL DATA: right little finger pain, trauma

RIGHT LITTLE FINGER 2+V

[view not recorded]
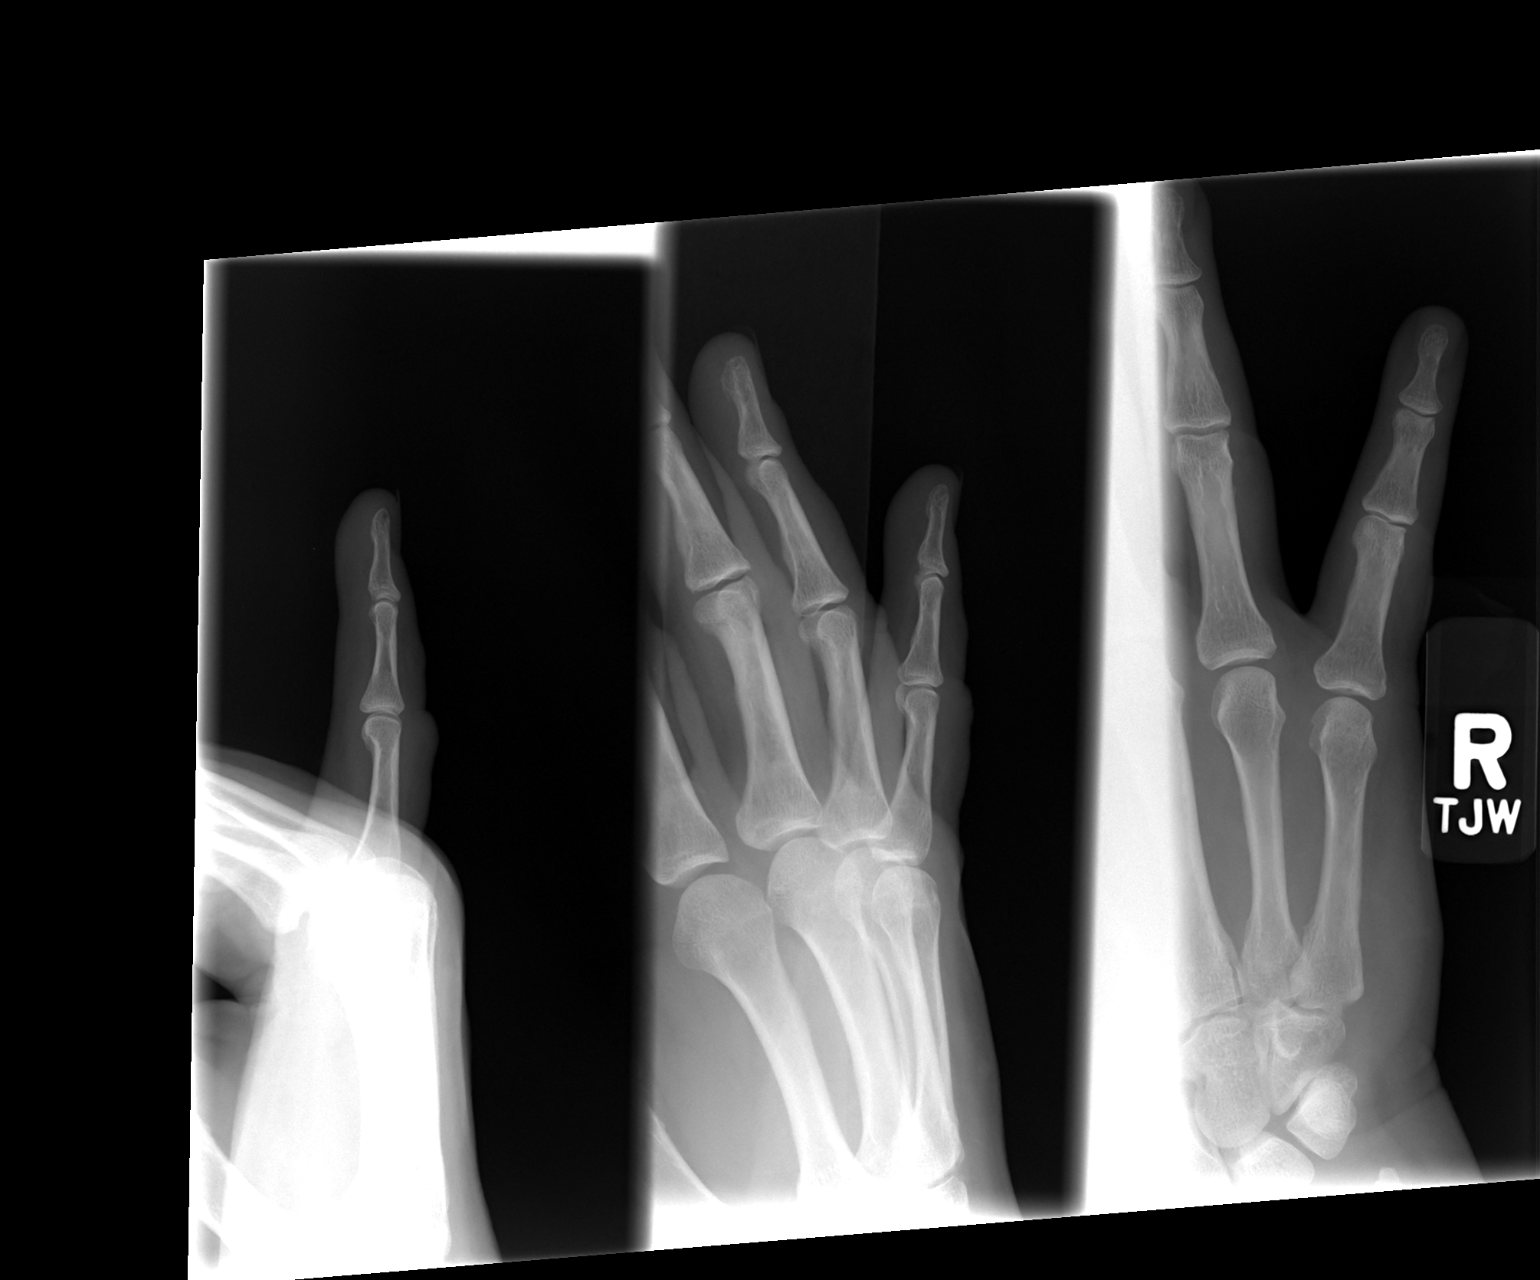

[1 of 1 positions shown; findings below may reference images not displayed]

FINDINGS: No fracture or dislocation.  Soft tissue swelling around
PIP joint.
IMPRESSION: Soft tissue swelling, no fracture.

## 2014-05-10 NOTE — Op Note (Signed)
PATIENT NAME:  Summer Brown, Summer Brown MR#:  [id number] DATE OF BIRTH:  Sep 04, 1955  DATE OF PROCEDURE:  09/21/2012  PREOPERATIVE DIAGNOSIS: Right carpal tunnel syndrome.   POSTOPERATIVE DIAGNOSIS: Right carpal tunnel syndrome.   PROCEDURE: Right carpal tunnel release.   SURGEON: Laurene Footman, MD  ANESTHESIA: General.    DESCRIPTION OF PROCEDURE: The patient was brought to the operating room, and after adequate anesthesia was obtained, the right arm was prepped and draped in the usual sterile fashion. After patient identification and timeout procedures were completed, the tourniquet was raised to 250 mmHg. An approximately 2.5 cm incision was made in line with the ring metacarpal starting at the distal wrist flexion crease. Skin and subcutaneous tissue were incised and the transverse carpal ligament identified. It was opened distally, and a hemostat was placed underneath to protect the underlying structure. Release was carried out first distally so  there was fat noted under the ligament. Going proximally, the ligament appeared quite thick, and the compression appeared to be in the proximal portion of the canal. After release, there was good vascular blush to the nerve. There was moderate flexor tenosynovitis. The wound was irrigated and then closed with simple interrupted 4-0 nylon skin sutures, 10 mL of 0.5% Sensorcaine infiltrated for postop analgesia.   ESTIMATED BLOOD LOSS: Minimal.   COMPLICATIONS: None.   SPECIMEN: None.        TOURNIQUET TIME: 17 minutes at 250 mmHg.  ____________________________ Laurene Footman, MD [id number] D: 09/21/2012 20:15:11 ET T: 09/21/2012 20:59:12 ET JOB#: [id number]  cc: Laurene Footman, MD, <Dictator> Laurene Footman MD ELECTRONICALLY SIGNED 09/22/2012 7:13

## 2014-05-15 ENCOUNTER — Other Ambulatory Visit: Payer: Self-pay | Admitting: Medical Oncology

## 2014-05-16 ENCOUNTER — Ambulatory Visit (INDEPENDENT_AMBULATORY_CARE_PROVIDER_SITE_OTHER): Payer: Managed Care, Other (non HMO) | Admitting: Internal Medicine

## 2014-05-16 ENCOUNTER — Encounter: Payer: Self-pay | Admitting: Internal Medicine

## 2014-05-16 VITALS — BP 124/86 | HR 108 | Temp 98.5°F | Wt 306.0 lb

## 2014-05-16 DIAGNOSIS — B9789 Other viral agents as the cause of diseases classified elsewhere: Principal | ICD-10-CM

## 2014-05-16 DIAGNOSIS — J069 Acute upper respiratory infection, unspecified: Secondary | ICD-10-CM

## 2014-05-16 NOTE — Progress Notes (Signed)
Pre visit review using our clinic review tool, if applicable. No additional management support is needed unless otherwise documented below in the visit note. 

## 2014-05-16 NOTE — Patient Instructions (Signed)

## 2014-05-16 NOTE — Progress Notes (Signed)
HPI  Pt presents to the clinic today with c/o nasal congestion, sore throat, cough and chills. This started 4 days ago. She is not blowing any mucous out of her nose. The cough is productive of white mucous. The coughing seems to be worse at night. She denies fever, chills or body aches. She has taken Tylenol and Benadryl without much relief. She has not history of seasonal allergies. She has not had sick contacts that she is aware.  Review of Systems    Past Medical History  Diagnosis Date  . Ulcer   . Chronic back pain 12/12/2013    No family history on file.  History   Social History  . Marital Status: Single    Spouse Name: N/A  . Number of Children: N/A  . Years of Education: N/A   Occupational History  .  Other    [employer]   Social History Main Topics  . Smoking status: Never Smoker   . Smokeless tobacco: Never Used  . Alcohol Use: Yes     Comment: rare  . Drug Use: No  . Sexual Activity: Not on file   Other Topics Concern  . 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 Codeine Itching     Constitutional: Positive fatigue. Denies headache, fever or abrupt weight changes.  HEENT:  Positive nasal congestion and sore throat. Denies eye redness, ear pain, ringing in the ears, wax buildup, runny nose or bloody nose. Respiratory: Positive cough. Denies difficulty breathing or shortness of breath.  Cardiovascular: Denies chest pain, chest tightness, palpitations or swelling in the hands or feet.   No other specific complaints in a complete review of systems (except as listed in HPI above).  Objective:  BP 124/86 mmHg  Pulse 108  Temp(Src) 98.5 F (36.9 C) (Oral)  Wt 306 lb (138.801 kg)  SpO2 98%   General: Appears her stated age, obese in NAD. HEENT: Head: normal shape and size, no sinus tenderness noted; Eyes: sclera white, no icterus, conjunctiva pink; Ears: Tm's gray and  intact, normal light reflex; Nose: mucosa pink and moist, septum midline; Throat/Mouth: Teeth present, mucosa erythematous and moist, no exudate noted, no lesions or ulcerations noted.  Neck: No adenopathy noted. Cardiovascular: Tachycardic with normal rhythm. S1,S2 noted.  No murmur, rubs or gallops noted.  Pulmonary/Chest: Normal effort and positive vesicular breath sounds. No respiratory distress. No wheezes, rales or ronchi noted.      Assessment & Plan:   Viral URI with cough:  Rapid Flu: flu Ibuprofen for sore throat/body aches Get some rest and drink plenty of fluids Advised her to take Delsym or Nyquil OTC  RTC as needed or if symptoms persist.

## 2014-07-18 ENCOUNTER — Telehealth: Payer: Self-pay | Admitting: *Deleted

## 2014-07-18 NOTE — Telephone Encounter (Signed)
Lm on pts vm regarding scheduling mammogram

## 2014-08-07 ENCOUNTER — Encounter: Payer: Self-pay | Admitting: Family Medicine

## 2014-08-07 ENCOUNTER — Ambulatory Visit (INDEPENDENT_AMBULATORY_CARE_PROVIDER_SITE_OTHER): Payer: No Typology Code available for payment source | Admitting: Family Medicine

## 2014-08-07 VITALS — BP 118/70 | HR 93 | Temp 98.0°F | Wt 316.4 lb

## 2014-08-07 DIAGNOSIS — I872 Venous insufficiency (chronic) (peripheral): Secondary | ICD-10-CM | POA: Diagnosis not present

## 2014-08-07 DIAGNOSIS — R609 Edema, unspecified: Secondary | ICD-10-CM | POA: Diagnosis not present

## 2014-08-07 LAB — COMPREHENSIVE METABOLIC PANEL
ALBUMIN: 3.8 g/dL (ref 3.5–5.2)
ALK PHOS: 76 U/L (ref 39–117)
ALT: 29 U/L (ref 0–35)
AST: 22 U/L (ref 0–37)
BUN: 10 mg/dL (ref 6–23)
CO2: 33 mEq/L — ABNORMAL HIGH (ref 19–32)
CREATININE: 0.71 mg/dL (ref 0.40–1.20)
Calcium: 9.2 mg/dL (ref 8.4–10.5)
Chloride: 101 mEq/L (ref 96–112)
GFR: 89.24 mL/min (ref >60.00)
Glucose, Bld: 83 mg/dL (ref 70–99)
Potassium: 3.9 mEq/L (ref 3.5–5.1)
SODIUM: 139 meq/L (ref 135–145)
Total Bilirubin: 0.4 mg/dL (ref 0.2–1.2)
Total Protein: 7 g/dL (ref 6.0–8.3)

## 2014-08-07 LAB — TSH: TSH: 3.16 u[IU]/mL (ref 0.35–4.50)

## 2014-08-07 NOTE — Assessment & Plan Note (Addendum)
Will eval for acute causes but most likely flare of acute swelling  ( from heat, travel, salt, recent weight gain with inactivity)on top of chronic venous insufficiecy ( vein issue, obesity, inactivity). Eval with TSH, CMET.  Recent EKG nml prior to surgery 2 months ago.  If labs normal can consider diuretic, but this may have limited utility given venous insufficiency component.  Also recommended elevation and compression hose rx given.

## 2014-08-07 NOTE — Patient Instructions (Addendum)
Stop at lab on way out.  Elevate feet above your heart when sitting.  Start compression hose for swelling when on feet.  If labs are normal we can consider fluids pill temporarily. Start water exercise or a least walking 3-5 times a week. Stop at front desk for nutrition referral.

## 2014-08-07 NOTE — Progress Notes (Signed)
   Subjective:    Patient ID: Summer Brown, female    DOB: 05-27-1955, 59 y.o.   MRN: [id number]  HPI  59 year old  morbidly female pt of Dr. Loletha Grayer presents with new onset peripheral swelling in feet and ankles.  She reports she recently went to [facility] last weekend, flew on plane 2 hours each way.  Ate more sodium on trip, in heat. On feet a lot.  She has history of intermittent swellng  In legs for years, this episode it is worse than ever before.  She has had a lont of weight gain since surgeries in last few years.  Occ shortness of breath, no new chest pain. No new medications. She is not currently using NSAIDs  Wt Readings from Last 3 Encounters:  08/07/14 316 lb 6.4 oz (143.518 kg)  05/16/14 306 lb (138.801 kg)  12/12/13 306 lb 12 oz (139.141 kg)   Body mass index is 48.8 kg/(m^2).  BP Readings from Last 3 Encounters:  08/07/14 118/70  05/16/14 124/86  12/12/13 128/84   EKG nml  2 months ago with surgery.  Social History /Family History/Past Medical History reviewed and updated if needed.   Review of Systems  Constitutional: Negative for fever and fatigue.  HENT: Negative for ear pain.   Eyes: Negative for pain.  Respiratory: Positive for shortness of breath. Negative for cough and wheezing.   Cardiovascular: Positive for leg swelling. Negative for chest pain and palpitations.  Gastrointestinal: Positive for abdominal pain.       Objective:   Physical Exam  Constitutional: Vital signs are normal. She appears well-developed and well-nourished. She is cooperative.  Non-toxic appearance. She does not appear ill. No distress.  Morbid obesity  HENT:  Head: Normocephalic.  Right Ear: Hearing, tympanic membrane, external ear and ear canal normal. Tympanic membrane is not erythematous, not retracted and not bulging.  Left Ear: Hearing, tympanic membrane, external ear and ear canal normal. Tympanic membrane is not erythematous, not retracted and not bulging.  Nose: No  mucosal edema or rhinorrhea. Right sinus exhibits no maxillary sinus tenderness and no frontal sinus tenderness. Left sinus exhibits no maxillary sinus tenderness and no frontal sinus tenderness.  Mouth/Throat: Uvula is midline, oropharynx is clear and moist and mucous membranes are normal.  Eyes: Conjunctivae, EOM and lids are normal. Pupils are equal, round, and reactive to light. Lids are everted and swept, no foreign bodies found.  Neck: Trachea normal and normal range of motion. Neck supple. Carotid bruit is not present. No thyroid mass and no thyromegaly present.  Cardiovascular: Normal rate, regular rhythm, S1 normal, S2 normal, normal heart sounds, intact distal pulses and normal pulses.  Exam reveals no gallop and no friction rub.   No murmur heard. Pulmonary/Chest: Effort normal and breath sounds normal. No tachypnea. No respiratory distress. She has no decreased breath sounds. She has no wheezes. She has no rhonchi. She has no rales.  Abdominal: Soft. Normal appearance and bowel sounds are normal. There is no tenderness.  Neurological: She is alert.  Skin: Skin is warm, dry and intact. No rash noted.  Psychiatric: Her speech is normal and behavior is normal. Judgment and thought content normal. Her mood appears not anxious. Cognition and memory are normal. She does not exhibit a depressed mood.          Assessment & Plan:

## 2014-08-07 NOTE — Assessment & Plan Note (Signed)
Refer to nutritionist.  Start exercise.. Water or other low impact.

## 2014-08-07 NOTE — Assessment & Plan Note (Signed)
Needs weight loss. 

## 2014-08-07 NOTE — Progress Notes (Signed)
Pre visit review using our clinic review tool, if applicable. No additional management support is needed unless otherwise documented below in the visit note. 

## 2014-08-08 ENCOUNTER — Telehealth: Payer: Self-pay | Admitting: *Deleted

## 2014-08-08 MED ORDER — HYDROCHLOROTHIAZIDE 25 MG PO TABS: 25.0000 mg | ORAL_TABLET | Freq: Every day | ORAL | Status: DC

## 2014-08-08 NOTE — Telephone Encounter (Signed)
-----   Message from Jinny Sanders, MD sent at 08/07/2014  5:14 PM EDT ----- Notify pt labs  Are normal. If she want we can call in a rx for HCTZ 25 mg 1 tab po daily prn swelling. #30 0 RF. Have her keep follow up with Dr, Lorelei Pont.

## 2014-08-08 NOTE — Telephone Encounter (Signed)
Summer Brown notified as instructed by telephone.  She would like to try the HCTZ for swelling.  Prescription sent to [facility].  Follow up appointment scheduled for 09/03/2013 at 9:30 am with Dr. Lorelei Pont.

## 2014-09-04 ENCOUNTER — Telehealth: Payer: Self-pay | Admitting: Family Medicine

## 2014-09-04 ENCOUNTER — Ambulatory Visit (INDEPENDENT_AMBULATORY_CARE_PROVIDER_SITE_OTHER): Payer: No Typology Code available for payment source | Admitting: Family Medicine

## 2014-09-04 ENCOUNTER — Ambulatory Visit: Payer: No Typology Code available for payment source | Admitting: Family Medicine

## 2014-09-04 ENCOUNTER — Encounter: Payer: Self-pay | Admitting: Family Medicine

## 2014-09-04 VITALS — BP 128/76 | HR 88 | Temp 98.5°F | Ht 67.5 in | Wt 314.8 lb

## 2014-09-04 DIAGNOSIS — R609 Edema, unspecified: Secondary | ICD-10-CM | POA: Diagnosis not present

## 2014-09-04 DIAGNOSIS — I872 Venous insufficiency (chronic) (peripheral): Secondary | ICD-10-CM

## 2014-09-04 DIAGNOSIS — R0601 Orthopnea: Secondary | ICD-10-CM

## 2014-09-04 DIAGNOSIS — J069 Acute upper respiratory infection, unspecified: Secondary | ICD-10-CM

## 2014-09-04 DIAGNOSIS — B9789 Other viral agents as the cause of diseases classified elsewhere: Secondary | ICD-10-CM

## 2014-09-04 DIAGNOSIS — R0609 Other forms of dyspnea: Secondary | ICD-10-CM | POA: Diagnosis not present

## 2014-09-04 DIAGNOSIS — R0600 Dyspnea, unspecified: Secondary | ICD-10-CM

## 2014-09-04 NOTE — Progress Notes (Signed)
Dr. Frederico Hamman T. Kenard Morawski, MD, [facility] [place], [address] Phone: [phone] Fax: [phone]  09/04/2014  Patient: Summer Brown, MRN: [id number], DOB: 1955-02-19, 59 y.o.  Primary Physician:  Owens Loffler, MD  Chief Complaint: Follow-up; Cough; Diarrhea; Nasal Congestion; and Chills  Subjective:   HAEDYN Brown is a 59 y.o. very pleasant female patient who presents with the following:  Having some peripheral edema. This is been an ongoing problem with the patient for months.  She recently saw one of my partners to felt like she most likely had some chronic venous insufficiency and recommended some compression stockings and putting her feet up.  Patient has extensive varicosities and other vascular changes in her lower extremity and she has a BMI of Body mass index is 48.54 kg/(m^2).   Sinuses and throat.  4-5 days has been sick.  Sore throat, congestion, cough, nasal drainage.  She also does feel like she gets out of breath fairly quickly with minimal exertion even just walking a short distance or going up a flight of stairs.  03/2014 - had back surgery.  Doing much better now.   Wt Readings from Last 3 Encounters:  09/04/14 314 lb 12 oz (142.77 kg)  08/07/14 316 lb 6.4 oz (143.518 kg)  05/16/14 306 lb (138.801 kg)      Past Medical History, Surgical History, Social History, Family History, Problem List, Medications, and Allergies have been reviewed and updated if relevant.  Patient Active Problem List   Diagnosis Date Noted  . Morbid obesity 08/07/2014  . Chronic venous insufficiency 08/07/2014  . Peripheral edema 08/07/2014  . Chronic back pain 12/12/2013  . Polyarthralgia 07/09/2013    Past Medical History  Diagnosis Date  . Ulcer   . Chronic back pain 12/12/2013    No past surgical history on file.  Social History   Social History  . Marital Status: Single    Spouse Name: N/A  .  Number of Children: N/A  . Years of Education: N/A   Occupational History  .  Other    [employer]   Social History Main Topics  . Smoking status: Never Smoker   . Smokeless tobacco: Never Used  . Alcohol Use: Yes     Comment: rare  . Drug Use: No  . Sexual Activity: Not on file   Other Topics Concern  . 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  Allergies  Allergen Reactions  . Codeine Itching    Medication list reviewed and updated in full in [facility].   GEN: No acute illnesses, no fevers, chills. GI: No n/v/d, eating normally Pulm: as above Interactive and getting along well at home.  Otherwise, ROS is as per the HPI.  Objective:   BP 128/76 mmHg  Pulse 88  Temp(Src) 98.5 F (36.9 C) (Oral)  Ht 5' 7.5" (1.715 m)  Wt 314 lb 12 oz (142.77 kg)  BMI 48.54 kg/m2  GEN: WDWN, NAD, Non-toxic, A & O x 3 HEENT: Atraumatic, Normocephalic. Neck supple. No masses, No LAD. Ears and Nose: No external deformity. CV: RRR, No M/G/R. No JVD. No thrill. No extra heart sounds. PULM: CTA B, no wheezes, crackles, rhonchi. No retractions. No resp. distress. No accessory muscle use. EXTR: No c/c/ tr - 1+ LE edema NEURO Normal gait.  PSYCH: Normally  interactive. Conversant. Not depressed or anxious appearing.  Calm demeanor.   Laboratory and Imaging Data: Results for orders placed or performed in visit on 08/07/14  Comprehensive metabolic panel  Result Value Ref Range   Sodium 139 135 - 145 mEq/L   Potassium 3.9 3.5 - 5.1 mEq/L   Chloride 101 96 - 112 mEq/L   CO2 33 (H) 19 - 32 mEq/L   Glucose, Bld 83 70 - 99 mg/dL   BUN 10 6 - 23 mg/dL   Creatinine, Ser 0.71 0.40 - 1.20 mg/dL   Total Bilirubin 0.4 0.2 - 1.2 mg/dL   Alkaline Phosphatase 76 39 - 117 U/L   AST 22 0 - 37 U/L   ALT 29 0 - 35 U/L   Total Protein 7.0 6.0 - 8.3 g/dL   Albumin 3.8 3.5 - 5.2 g/dL   Calcium 9.2 8.4 - 10.5 mg/dL    GFR 89.24 >60.00 mL/min  TSH  Result Value Ref Range   TSH 3.16 0.35 - 4.50 uIU/mL     Assessment and Plan:   Peripheral edema - Plan: ECHOCARDIOGRAM COMPLETE  Dyspnea on exertion - Plan: ECHOCARDIOGRAM COMPLETE  Orthopnea - Plan: ECHOCARDIOGRAM COMPLETE  Morbid obesity  Chronic venous insufficiency  Viral URI with cough  Peripheral edema.  All basic laboratories look fine. More likely this is secondary to chronic venous insufficiency along with morbid obesity.  She also is having some dyspnea on exertion and orthopnea, so heart failure cannot be excluded.  Think it would be reasonable to go ahead and check an echocardiogram.  New Prescriptions   No medications on file   Orders Placed This Encounter  Procedures  . ECHOCARDIOGRAM COMPLETE    Signed,  Frederico Hamman T. Rayna Brenner, MD   Patient's Medications  New Prescriptions   No medications on file  Previous Medications   CYCLOBENZAPRINE (FLEXERIL) 10 MG TABLET    Take 10 mg by mouth 3 (three) times daily as needed for muscle spasms.  Modified Medications   No medications on file  Discontinued Medications   HYDROCHLOROTHIAZIDE (HYDRODIURIL) 25 MG TABLET    Take 1 tablet (25 mg total) by mouth daily.   IBUPROFEN (ADVIL,MOTRIN) 600 MG TABLET    Take 600 mg by mouth every 6 (six) hours as needed for pain.   MIRABEGRON (MYRBETRIQ PO)    Take 1 tablet by mouth.   NAPROXEN SODIUM (ALEVE) 220 MG TABLET    Take 220 mg by mouth 2 (two) times daily with a meal.   TRAMADOL (ULTRAM) 50 MG TABLET    Take 1 tablet by mouth every 6 (six) hours as needed.

## 2014-09-04 NOTE — Patient Instructions (Signed)

## 2014-09-04 NOTE — Progress Notes (Signed)
Pre visit review using our clinic review tool, if applicable. No additional management support is needed unless otherwise documented below in the visit note. 

## 2014-09-04 NOTE — Telephone Encounter (Signed)
LM asking pt to call office. Please reschedule appt 09/04/14 due to computer problems.

## 2014-09-12 ENCOUNTER — Encounter: Payer: Self-pay | Admitting: Dietician

## 2014-09-12 ENCOUNTER — Encounter: Payer: No Typology Code available for payment source | Attending: Family Medicine | Admitting: Dietician

## 2014-09-12 DIAGNOSIS — Z713 Dietary counseling and surveillance: Secondary | ICD-10-CM | POA: Insufficient documentation

## 2014-09-12 DIAGNOSIS — Z6841 Body Mass Index (BMI) 40.0 and over, adult: Secondary | ICD-10-CM | POA: Insufficient documentation

## 2014-09-12 NOTE — Patient Instructions (Addendum)
-  Keep working on reducing sodium intake  -Use frozen vegetables instead of canned  -Fill up on non starchy vegetables (any veggie that is not corn, peas, or potatoes)  -Veggies are good raw or cooked, fresh or frozen  -Follow MyPlate  -Work on having a protein food with each meal  -Watch portion sizes  -Buy pre portioned foods  -Pay attention to serving sizes on the food label  -Avoid skipping meals  -Think about joining a [employer] or anther facility -Find an exercise that you enjoy and use it as stress relief! -Listen to your body and stop if something hurts

## 2014-09-12 NOTE — Progress Notes (Signed)
  Medical Nutrition Therapy:  Appt start time: 1030 end time:  1130.   Assessment:  Primary concerns today: Summer Brown is here today to discuss her weight. She has gained weight in the last few years from surgeries and being out of work. 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 She lives with her boyfriend and his mom. She and her boyfriend's mom do the grocery shopping. Summer Brown does all of the cooking. Her father had diabetes and Summer Brown states she would like to prevent diabetes. She feels like getting a job and her own place would help her become healthier. "I feel trapped." She has been looking for a job and has joined a Education officer, environmental. She feels like her weight is related to stress. She also does not sleep throughout the night and sometimes skips meals. Summer Brown states that "supper is her problem."   Preferred Learning Style:   No preference indicated   Learning Readiness:   Contemplating  Ready  MEDICATIONS: none   DIETARY INTAKE:  Avoided foods include eggplant.    24-hr recall:  B ( AM): oatmeal  Snk ( AM):   L ( PM): salad Snk ( PM):  D ( PM): chicken, green beans, baked potato OR salad OR tacos Snk ( PM): fruit  Beverages: coffee with flavored creamer, water, soda  Usual physical activity: ADLs, push mow lawn  Estimated energy needs: 1400-1600 calories  Progress Towards Goal(s):  In progress.   Nutritional Diagnosis:  Tyrone-3.3 Overweight/obesity As related to history of erratic meal pattern, stress, physical inactivity, and excessive energy intake.  As evidenced by BMI 47.    Intervention:  Nutrition counseling provided. Goals: -Keep working on reducing sodium intake  -Use frozen vegetables instead of canned -Fill up on non starchy vegetables (any veggie that is not corn, peas, or potatoes)  -Veggies are good raw or cooked, fresh or frozen  -Follow MyPlate -Work on having a protein food with each meal -Watch portion sizes  -Buy pre portioned foods  -Pay attention to  serving sizes on the food label -Avoid skipping meals -Think about joining a [employer] or anther facility -Find an exercise that you enjoy and use it as stress relief! -Listen to your body and stop if something hurts  Teaching Method Utilized:  Visual Auditory Hands on  Handouts given during visit include:  Low sodium flavoring tips  MyPlate  Meal planning card  Barriers to learning/adherence to lifestyle change: stress  Demonstrated degree of understanding via:  Teach Back   Monitoring/Evaluation:  Dietary intake, exercise, and body weight prn.

## 2014-09-16 ENCOUNTER — Encounter: Payer: Self-pay | Admitting: Family Medicine

## 2014-09-16 ENCOUNTER — Telehealth: Payer: Self-pay

## 2014-09-16 ENCOUNTER — Ambulatory Visit (INDEPENDENT_AMBULATORY_CARE_PROVIDER_SITE_OTHER): Payer: No Typology Code available for payment source

## 2014-09-16 ENCOUNTER — Other Ambulatory Visit: Payer: Self-pay

## 2014-09-16 DIAGNOSIS — R0601 Orthopnea: Secondary | ICD-10-CM | POA: Diagnosis not present

## 2014-09-16 DIAGNOSIS — R0600 Dyspnea, unspecified: Secondary | ICD-10-CM

## 2014-09-16 DIAGNOSIS — R0609 Other forms of dyspnea: Secondary | ICD-10-CM

## 2014-09-16 DIAGNOSIS — R609 Edema, unspecified: Secondary | ICD-10-CM | POA: Diagnosis not present

## 2014-09-16 LAB — HM MAMMOGRAPHY

## 2014-09-16 NOTE — Telephone Encounter (Signed)
Spoke with patient to remind her of being due for a Mammogram. Patient declined any information on scheduling one at this time.

## 2014-09-23 ENCOUNTER — Emergency Department: Payer: No Typology Code available for payment source

## 2014-09-23 ENCOUNTER — Encounter: Payer: Self-pay | Admitting: Medical Oncology

## 2014-09-23 ENCOUNTER — Emergency Department
Admission: EM | Admit: 2014-09-23 | Discharge: 2014-09-23 | Disposition: A | Discharge status: Home / Self Care | Payer: No Typology Code available for payment source | Attending: Emergency Medicine | Admitting: Emergency Medicine

## 2014-09-23 DIAGNOSIS — G8929 Other chronic pain: Secondary | ICD-10-CM | POA: Diagnosis not present

## 2014-09-23 DIAGNOSIS — W01198A Fall on same level from slipping, tripping and stumbling with subsequent striking against other object, initial encounter: Secondary | ICD-10-CM | POA: Diagnosis not present

## 2014-09-23 DIAGNOSIS — Y998 Other external cause status: Secondary | ICD-10-CM | POA: Diagnosis not present

## 2014-09-23 DIAGNOSIS — IMO0002 Reserved for concepts with insufficient information to code with codable children: Secondary | ICD-10-CM

## 2014-09-23 DIAGNOSIS — T07XXXA Unspecified multiple injuries, initial encounter: Secondary | ICD-10-CM

## 2014-09-23 DIAGNOSIS — S0990XA Unspecified injury of head, initial encounter: Secondary | ICD-10-CM | POA: Diagnosis present

## 2014-09-23 DIAGNOSIS — Y9301 Activity, walking, marching and hiking: Secondary | ICD-10-CM | POA: Diagnosis not present

## 2014-09-23 DIAGNOSIS — Y9289 Other specified places as the place of occurrence of the external cause: Secondary | ICD-10-CM | POA: Diagnosis not present

## 2014-09-23 DIAGNOSIS — S59911A Unspecified injury of right forearm, initial encounter: Secondary | ICD-10-CM | POA: Diagnosis not present

## 2014-09-23 DIAGNOSIS — S0181XA Laceration without foreign body of other part of head, initial encounter: Secondary | ICD-10-CM | POA: Insufficient documentation

## 2014-09-23 DIAGNOSIS — W19XXXA Unspecified fall, initial encounter: Secondary | ICD-10-CM

## 2014-09-23 MED ORDER — TRAMADOL HCL 50 MG PO TABS
50.0000 mg | ORAL_TABLET | Freq: Once | ORAL | Status: AC
Start: 2014-09-23 — End: 2014-09-23
  Administered 2014-09-23: 50 mg via ORAL
  Filled 2014-09-23: qty 1

## 2014-09-23 MED ORDER — TRAMADOL HCL 50 MG PO TABS: 50.0000 mg | ORAL_TABLET | Freq: Four times a day (QID) | ORAL | Status: AC | PRN

## 2014-09-23 NOTE — ED Notes (Signed)
Patient transported to CT 

## 2014-09-23 NOTE — ED Provider Notes (Signed)
[facility] Emergency Department Provider Note  Time seen: 6:31 PM  I have reviewed the triage vital signs and the nursing notes.   HISTORY  Chief Complaint Fall    HPI Summer Brown is a 59 y.o. female with a past medical history of chronic back pain presents the emergency department after a fall. According to the patient were walking to the movie theater when she tripped causing her to fall forwards. She states she caught herself on the railing, as she fell hitting her head on the railing as well as her right arm. Denies loss of consciousness, but states she felt incredibly nauseated. Describes her head pain is moderate. No modifying factors identified. Patient also has 2 lacerations to the head as well.Hemostatic upon arrival.    Past Medical History  Diagnosis Date  . Ulcer   . Chronic back pain 12/12/2013    Patient Active Problem List   Diagnosis Date Noted  . Morbid obesity 08/07/2014  . Chronic venous insufficiency 08/07/2014  . Peripheral edema 08/07/2014  . Chronic back pain 12/12/2013  . Polyarthralgia 07/09/2013    Past Surgical History  Procedure Laterality Date  . Ovarian cyst surgery    . Fracture surgery    . Spine surgery    . Nose surgery    . Back surgery      Current Outpatient Rx  Name  Route  Sig  Dispense  Refill  . cyclobenzaprine (FLEXERIL) 10 MG tablet   Oral   Take 10 mg by mouth 3 (three) times daily as needed for muscle spasms.           Allergies Codeine  Family History  Problem Relation Age of Onset  . Diabetes Father     Social History Social History  Substance Use Topics  . Smoking status: Never Smoker   . Smokeless tobacco: Never Used  . Alcohol Use: Yes     Comment: rare    Review of Systems Constitutional: Negative for fever. Negative for loss of consciousness. Cardiovascular: Negative for chest pain. Respiratory: Negative for shortness of breath. Gastrointestinal: Negative for  abdominal pain. Negative for vomiting. Positive for nausea. Musculoskeletal: No neck or back pain. Skin: Cut above her right eye, cut to the right temple. Neurological: Positive for headache. Negative for focal weakness or numbness. 10-point ROS otherwise negative.  ____________________________________________   PHYSICAL EXAM:  VITAL SIGNS: ED Triage Vitals  Enc Vitals Group     BP 09/23/14 1741 137/72 mmHg     Pulse Rate 09/23/14 1741 91     Resp 09/23/14 1741 18     Temp 09/23/14 1741 98.5 F (36.9 C)     Temp Source 09/23/14 1741 Oral     SpO2 09/23/14 1741 98 %     Weight 09/23/14 1741 315 lb (142.883 kg)     Height 09/23/14 1741 5\' 8"  (1.727 m)     Head Cir --      Peak Flow --      Pain Score 09/23/14 1742 10     Pain Loc --      Pain Edu? --      Excl. in [place]? --     Constitutional: Alert and oriented. Well appearing and in no distress. Eyes: Normal exam ENT   Head: Normocephalic. Small approximately 1.5 similar laceration/deep or abrasion above her right eyebrow, non-gaping. 1 cm laceration to the right temple, hemostatic non-gaping.   Nose: No congestion/rhinnorhea.   Mouth/Throat: Mucous membranes are moist.  Cardiovascular: Normal rate, regular rhythm. No murmur Respiratory: Normal respiratory effort without tachypnea nor retractions. Breath sounds are clear and equal bilaterally. No wheezes/rales/rhonchi. Gastrointestinal: Soft and nontender. No distention.  Obese. Musculoskeletal: Moderate tenderness palpation of the right forearm with mild swelling. Neurovascularly intact distally with 2+ radial pulse and good grip strength. Neurologic:  Normal speech and language. No gross focal neurologic deficits  Skin:  Aspiration/abrasions as above. Psychiatric: Mood and affect are normal. Speech and behavior are normal.   ____________________________________________      RADIOLOGY   x-ray/CT  pending  ____________________________________________   INITIAL IMPRESSION / ASSESSMENT AND PLAN / ED COURSE  Pertinent labs & imaging results that were available during my care of the patient were reviewed by me and considered in my medical decision making (see chart for details).  Patient with mechanical fall, hit her head, no LOC but positive nausea. We'll proceed with a CT head to rule out intracranial injury. Her right arm hematoma with moderate tenderness palpation. We'll obtain an x-ray to rule out fracture. 2 small lacerations on the hand, neither of which are gaping, both of which are hemostatic. I have placed Steri-Strips over the lacerations. We will treat the patient's headache with Ultram, and monitor in the emergency department.  Imaging pending, patient care signed out to evening provider.  ____________________________________________   FINAL CLINICAL IMPRESSION(S) / ED DIAGNOSES  Fall Laceration Contusions   Harvest Dark, MD 09/23/14 [phone]

## 2014-09-23 NOTE — ED Notes (Signed)
Pt tripped and fell hitting her head and injuring rt wrist.  Dr Kerman Passey steri stripped rt eyebrow.

## 2014-09-23 NOTE — Discharge Instructions (Signed)
You have been seen in the emergency department following a fall. Your x-ray and CT scan if shown largely normal results. Please take your prescribed Ultram as needed for discomfort, as written. Please follow-up with your primary care provider as soon as possible for recheck if symptoms continue. Return to the emergency department for any personally concerning symptoms.   Contusion A contusion is a deep bruise. Contusions happen when an injury causes bleeding under the skin. Signs of bruising include pain, puffiness (swelling), and discolored skin. The contusion may turn blue, purple, or yellow. HOME CARE   Put ice on the injured area.  Put ice in a plastic bag.  Place a towel between your skin and the bag.  Leave the ice on for 15-20 minutes, 03-04 times a day.  Only take medicine as told by your doctor.  Rest the injured area.  If possible, raise (elevate) the injured area to lessen puffiness. GET HELP RIGHT AWAY IF:   You have more bruising or puffiness.  You have pain that is getting worse.  Your puffiness or pain is not helped by medicine. MAKE SURE YOU:   Understand these instructions.  Will watch your condition.  Will get help right away if you are not doing well or get worse. Document Released: 06/23/2007 Document Revised: 03/29/2011 Document Reviewed: 11/09/2010 [facility] Patient Information 2015 [place], [place]. This information is not intended to replace advice given to you by your health care provider. Make sure you discuss any questions you have with your health care provider.  Head Injury You have a head injury. Headaches and throwing up (vomiting) are common after a head injury. It should be easy to wake up from sleeping. Sometimes you must stay in the hospital. Most problems happen within the first 24 hours. Side effects may occur up to 7-10 days after the injury.  WHAT ARE THE TYPES OF HEAD INJURIES? Head injuries can be as minor as a bump. Some head injuries  can be more severe. More severe head injuries include:  A jarring injury to the brain (concussion).  A bruise of the brain (contusion). This mean there is bleeding in the brain that can cause swelling.  A cracked skull (skull fracture).  Bleeding in the brain that collects, clots, and forms a bump (hematoma). WHEN SHOULD I GET HELP RIGHT AWAY?   You are confused or sleepy.  You cannot be woken up.  You feel sick to your stomach (nauseous) or keep throwing up (vomiting).  Your dizziness or unsteadiness is getting worse.  You have very bad, lasting headaches that are not helped by medicine. Take medicines only as told by your doctor.  You cannot use your arms or legs like normal.  You cannot walk.  You notice changes in the black spots in the center of the colored part of your eye (pupil).  You have clear or bloody fluid coming from your nose or ears.  You have trouble seeing. During the next 24 hours after the injury, you must stay with someone who can watch you. This person should get help right away (call 911 in the [place] if you start to shake and are not able to control it (have seizures), you pass out, or you are unable to wake up. HOW CAN I PREVENT A HEAD INJURY IN THE FUTURE?  Wear seat belts.  Wear a helmet while bike riding and playing sports like football.  Stay away from dangerous activities around the house. WHEN CAN I RETURN TO NORMAL ACTIVITIES  AND ATHLETICS? See your doctor before doing these activities. You should not do normal activities or play contact sports until 1 week after the following symptoms have stopped:  Headache that does not go away.  Dizziness.  Poor attention.  Confusion.  Memory problems.  Sickness to your stomach or throwing up.  Tiredness.  Fussiness.  Bothered by bright lights or loud noises.  Anxiousness or depression.  Restless sleep. MAKE SURE YOU:   Understand these instructions.  Will watch your  condition.  Will get help right away if you are not doing well or get worse. Document Released: 12/18/2007 Document Revised: 05/21/2013 Document Reviewed: 09/11/2012 [facility] Patient Information 2015 [place], [place]. This information is not intended to replace advice given to you by your health care provider. Make sure you discuss any questions you have with your health care provider.

## 2014-09-23 NOTE — ED Notes (Signed)
Pt reports falling pta, states that she tripped over her husbands feet and hit her head to a metal rail and injured her rt wrist. Pt denies LOC. Pt denies use of blood thinners.

## 2014-12-23 ENCOUNTER — Telehealth: Payer: Self-pay | Admitting: *Deleted

## 2014-12-23 NOTE — Telephone Encounter (Signed)
Left message for Ms. Kintzel to call our office to schedule flu vaccine or let us know if she has had once done somewhere else.

## 2015-07-18 ENCOUNTER — Ambulatory Visit (INDEPENDENT_AMBULATORY_CARE_PROVIDER_SITE_OTHER): Payer: Self-pay | Admitting: Family Medicine

## 2015-07-18 ENCOUNTER — Encounter: Payer: Self-pay | Admitting: Family Medicine

## 2015-07-18 VITALS — BP 120/70 | HR 96 | Temp 98.5°F | Wt 321.8 lb

## 2015-07-18 DIAGNOSIS — K130 Diseases of lips: Secondary | ICD-10-CM

## 2015-07-18 DIAGNOSIS — N760 Acute vaginitis: Secondary | ICD-10-CM

## 2015-07-18 MED ORDER — FLUCONAZOLE 150 MG PO TABS: 150.0000 mg | ORAL_TABLET | Freq: Once | ORAL | Status: DC

## 2015-07-18 NOTE — Progress Notes (Signed)
Pre visit review using our clinic review tool, if applicable. No additional management support is needed unless otherwise documented below in the visit note.  Seen at spine clinic about 3 weeks ago.  Put on prednisone or ibuprofen, patient wasn't sure which.  Afterward, she had upper lip irritation but no blistering.  "It was like I had burned my lip."  She may have actually scorched her lip on hot coffee.  Used neosporin.  No lower lip swelling.  No airway sx.  No tongue sx.   Now with vaginal irritation w/o discharge.  Not sexually active.  Tried monistat but had some burning with that.  No burning with urination.  She has had yeast infections prev with prednisone use.  No VB.    She thought the two issues were related, "like it was moving through my system."  D/w pt that the two don't appear to be related, at least the with oral sx not likely causing the vaginal sx, ie not down down the GI tract and then to the vaginal vault.    Meds, vitals, and allergies reviewed.   ROS: Per HPI unless specifically indicated in ROS section   nad ncat OP wnl except for minimal irritation on the midline of the upper lip, looks to be a healing nonacute shallow lesion.  No other lip changes.  No tongue changes.  Neck supple, no LA No stridor.   rrr ctab Abd soft, not ttp Ext w/o edema  Wet prep with yeast, no clue, no trich.  D/w pt at OV.

## 2015-07-18 NOTE — Patient Instructions (Signed)
I think your lip will gradually heal up.  Use diflucan and follow up with Dr. Lorelei Pont next week if not better.   Take care.  Glad to see you.

## 2015-07-20 DIAGNOSIS — N760 Acute vaginitis: Secondary | ICD-10-CM | POA: Insufficient documentation

## 2015-07-20 DIAGNOSIS — K130 Diseases of lips: Secondary | ICD-10-CM | POA: Insufficient documentation

## 2015-07-20 NOTE — Assessment & Plan Note (Signed)
Use diflucan and f/u prn.  D/w pt.  Okay for outpatient f/u.

## 2015-07-20 NOTE — Assessment & Plan Note (Signed)
Should heal up w/o intervention.  D/w pt. Doesn't appear to be anything systemic or alarming.

## 2015-11-03 ENCOUNTER — Ambulatory Visit (INDEPENDENT_AMBULATORY_CARE_PROVIDER_SITE_OTHER)
Admission: RE | Admit: 2015-11-03 | Discharge: 2015-11-03 | Disposition: A | Discharge status: Home / Self Care | Payer: Self-pay | Source: Ambulatory Visit | Attending: Family Medicine | Admitting: Family Medicine

## 2015-11-03 ENCOUNTER — Ambulatory Visit (INDEPENDENT_AMBULATORY_CARE_PROVIDER_SITE_OTHER): Payer: Self-pay | Admitting: Family Medicine

## 2015-11-03 ENCOUNTER — Encounter: Payer: Self-pay | Admitting: Family Medicine

## 2015-11-03 VITALS — BP 130/90 | HR 83 | Temp 98.3°F | Ht 67.5 in | Wt 324.8 lb

## 2015-11-03 DIAGNOSIS — M79671 Pain in right foot: Secondary | ICD-10-CM

## 2015-11-03 DIAGNOSIS — E65 Localized adiposity: Secondary | ICD-10-CM

## 2015-11-03 NOTE — Progress Notes (Signed)
Pre visit review using our clinic review tool, if applicable. No additional management support is needed unless otherwise documented below in the visit note. 

## 2015-11-03 NOTE — Progress Notes (Signed)
Dr. Frederico Hamman T. Sarin Comunale, MD, [facility] Primary [facility] and Sports Medicine [place], [address] Phone: [phone] Fax: [phone]  11/03/2015  Patient: Summer Brown, MRN: [id number], DOB: 1955-05-05, 60 y.o.  Primary Physician:  Owens Loffler, MD   Chief Complaint  Patient presents with  . Foot Pain    Right-Stepped on screw 2 to 3 weeks ago   Subjective:   Summer Brown is a 60 y.o. very pleasant female patient who presents with the following:  R heel - stepped on a screw 2-3 weeks ago She is having quite a bit of pain. She is able to work. She was wondering if she possibly could've broken anything when she stepped down on the scrape. He did not pierce the skin at all.  Past Medical History, Surgical History, Social History, Family History, Problem List, Medications, and Allergies have been reviewed and updated if relevant.  Patient Active Problem List   Diagnosis Date Noted  . Lip lesion 07/20/2015  . Vaginitis and vulvovaginitis 07/20/2015  . Morbid obesity ([place]) 08/07/2014  . Chronic venous insufficiency 08/07/2014  . Peripheral edema 08/07/2014  . Chronic back pain 12/12/2013  . Polyarthralgia 07/09/2013    Past Medical History:  Diagnosis Date  . Chronic back pain 12/12/2013  . Ulcer [facility])     Past Surgical History:  Procedure Laterality Date  . BACK SURGERY    . FRACTURE SURGERY    . NOSE SURGERY    . OVARIAN CYST SURGERY    . SPINE SURGERY      Social History   Social History  . Marital status: Single    Spouse name: N/A  . Number of children: N/A  . Years of education: N/A   Occupational History  .  Other    [employer]   Social History Main Topics  . Smoking status: Never Smoker  . Smokeless tobacco: Never Used  . Alcohol use Yes     Comment: rare  . Drug use: No  . Sexual activity: Not on file   Other Topics Concern  . 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 Diabetes Father     Allergies  Allergen Reactions  . Codeine Itching    Medication list reviewed and updated in full in [place].  GEN: No fevers, chills. Nontoxic. Primarily MSK c/o today. MSK: Detailed in the HPI GI: tolerating PO intake without difficulty Neuro: No numbness, parasthesias, or tingling associated. Otherwise the pertinent positives of the ROS are noted above.   Objective:   BP 130/90   Pulse 83   Temp 98.3 F (36.8 C) (Oral)   Ht 5' 7.5" (1.715 m)   Wt (!) 324 lb 12 oz (147.3 kg)   BMI 50.11 kg/m    GEN: WDWN, NAD, Non-toxic, Alert & Oriented x 3 HEENT: Atraumatic, Normocephalic.  Ears and Nose: No external deformity. EXTR: No clubbing/cyanosis/edema NEURO: Normal gait.  PSYCH: Normally interactive. Conversant. Not depressed or anxious appearing.  Calm demeanor.    Entirety of the foot including the entire forefoot and midfoot and ankle are nontender. All bony anatomy is nontender. Calcaneus is tender somewhat medially and laterally and to a greater degree on the plantar aspect. Nontender at the Achilles. No sign of wound.  Radiology: Dg Os Calcis Right  Result Date: 11/03/2015 CLINICAL DATA:  Stepped on screw. EXAM: RIGHT OS CALCIS - 2+ VIEW COMPARISON:  No recent prior. FINDINGS: Diffuse soft tissue swelling. No radiopaque foreign body. No acute bony abnormality identified. Diffuse degenerative change. IMPRESSION: 1. Diffuse soft tissue swelling.  No radiopaque foreign body noted. 2. No acute bony abnormality.  Diffuse degenerative change. Electronically Signed   By: Marcello Moores  Register   On: 11/03/2015 12:45     Assessment and Plan:   Fat pad syndrome  Pain of right heel - Plan: DG Os Calcis Right  Stone bruise. Reassurance. Some bone bruise likely. Recommended heel cup.  Follow-up: No Follow-up on file.  New Prescriptions   No medications on file   Modified Medications   No  medications on file   Orders Placed This Encounter  Procedures  . DG Os Calcis Right    Signed,  Tarryn Bogdan T. Olamide Carattini, MD   Patient's Medications  New Prescriptions   No medications on file  Previous Medications   CYCLOBENZAPRINE (FLEXERIL) 10 MG TABLET    Take 10 mg by mouth 3 (three) times daily as needed for muscle spasms.  Modified Medications   No medications on file  Discontinued Medications   FLUCONAZOLE (DIFLUCAN) 150 MG TABLET    Take 1 tablet (150 mg total) by mouth once.

## 2015-11-06 ENCOUNTER — Ambulatory Visit: Payer: Self-pay | Admitting: Primary Care

## 2015-12-09 ENCOUNTER — Other Ambulatory Visit: Payer: Self-pay | Admitting: Family Medicine

## 2015-12-09 ENCOUNTER — Telehealth: Payer: Self-pay

## 2015-12-09 DIAGNOSIS — G8929 Other chronic pain: Secondary | ICD-10-CM

## 2015-12-09 DIAGNOSIS — M79671 Pain in right foot: Principal | ICD-10-CM

## 2015-12-09 NOTE — Telephone Encounter (Signed)
Pt left v/m request referral to foot doctor; pt having problems walking on foot; this has been going on for couple of months; pt was seen 11/03/15. Pt request cb with appt for foot doctor.

## 2015-12-09 NOTE — Telephone Encounter (Signed)
done

## 2015-12-24 ENCOUNTER — Ambulatory Visit (INDEPENDENT_AMBULATORY_CARE_PROVIDER_SITE_OTHER): Payer: BLUE CROSS/BLUE SHIELD | Admitting: Podiatry

## 2015-12-24 ENCOUNTER — Ambulatory Visit (INDEPENDENT_AMBULATORY_CARE_PROVIDER_SITE_OTHER): Payer: BLUE CROSS/BLUE SHIELD

## 2015-12-24 ENCOUNTER — Encounter: Payer: Self-pay | Admitting: Podiatry

## 2015-12-24 VITALS — BP 98/61 | HR 64 | Resp 16 | Ht 68.5 in | Wt 310.0 lb

## 2015-12-24 DIAGNOSIS — M722 Plantar fascial fibromatosis: Secondary | ICD-10-CM

## 2015-12-24 MED ORDER — PREDNISONE 10 MG PO TABS: ORAL_TABLET | ORAL | 0 refills | Status: DC

## 2015-12-24 MED ORDER — TRIAMCINOLONE ACETONIDE 10 MG/ML IJ SUSP
10.0000 mg | Freq: Once | INTRAMUSCULAR | Status: AC
  Administered 2015-12-24: 10 mg

## 2015-12-24 NOTE — Patient Instructions (Signed)

## 2015-12-24 NOTE — Progress Notes (Signed)
   Subjective:    Patient ID: Summer Brown, female    DOB: 1955/10/27, 60 y.o.   MRN: [id number]  HPI Chief Complaint  Patient presents with  . Foot Injury    Right foot; heel; pt stated, "Stepped on a screw about 2 months ago, but did not puncture skin"      Review of Systems  All other systems reviewed and are negative.      Objective:   Physical Exam        Assessment & Plan:

## 2015-12-25 NOTE — Progress Notes (Signed)
Subjective:     Patient ID: Summer Brown, female   DOB: 1955-06-27, 60 y.o.   MRN: [id number]  HPI patient states that she stepped on a screw several months ago and she's had continued pain in her heel and she does not remember any puncture or there was no bleeding after the trauma   Review of Systems  All other systems reviewed and are negative.      Objective:   Physical Exam  Constitutional: She is oriented to person, place, and time.  Cardiovascular: Intact distal pulses.   Musculoskeletal: Normal range of motion.  Neurological: She is oriented to person, place, and time.  Skin: Skin is warm.  Nursing note and vitals reviewed.  neurovascular status intact muscle strength adequate range of motion within normal limits with a pleasant lady who is obese which is complicating factor. Quite a bit of discomfort plantar aspect of the right heel and visible inspection did not indicate any puncture holes redness or other indications of trauma. Patient has good digital perfusion and is well oriented 3     Assessment:     Plantar fasciitis right with probable cause of trauma with no indications that this is any kind of foreign body or other pathology    Plan:     H&P x-rays reviewed and at this time injected the right plantar fascia 3 mg Kenalog 5 mg Xylocaine and went ahead and applied fascial brace gave instructions on physical therapy stretching exercises and reappoint to recheck

## 2016-01-01 ENCOUNTER — Ambulatory Visit (INDEPENDENT_AMBULATORY_CARE_PROVIDER_SITE_OTHER): Payer: BLUE CROSS/BLUE SHIELD | Admitting: Podiatry

## 2016-01-01 ENCOUNTER — Encounter: Payer: Self-pay | Admitting: Podiatry

## 2016-01-01 DIAGNOSIS — M722 Plantar fascial fibromatosis: Secondary | ICD-10-CM | POA: Diagnosis not present

## 2016-01-01 MED ORDER — TRIAMCINOLONE ACETONIDE 10 MG/ML IJ SUSP
10.0000 mg | Freq: Once | INTRAMUSCULAR | Status: AC
  Administered 2016-01-01: 10 mg

## 2016-01-01 NOTE — Progress Notes (Signed)
Subjective:     Patient ID: Summer Brown, female   DOB: 1955-12-30, 60 y.o.   MRN: [id number]  HPI patient states that she is improving but she still has an area on his right foot that's very tender   Review of Systems     Objective:   Physical Exam Neurovascular status intact muscle strength adequate with improved inflammation plantar right stating that the brace and boot are helping her but there is one area that still quite tender when palpated    Assessment:     Appears to be plantar fasciitis improved but present    Plan:     Reinjected the plantar fascia right 3 Milligan Kenalog 5 mill grams Xylocaine and advised on physical therapy and reappoint to recheck

## 2016-05-06 ENCOUNTER — Ambulatory Visit (INDEPENDENT_AMBULATORY_CARE_PROVIDER_SITE_OTHER): Payer: Self-pay | Admitting: Podiatry

## 2016-05-06 DIAGNOSIS — M722 Plantar fascial fibromatosis: Secondary | ICD-10-CM

## 2016-05-06 MED ORDER — PREDNISONE 10 MG PO TABS: ORAL_TABLET | ORAL | 0 refills | Status: DC

## 2016-05-06 MED ORDER — TRIAMCINOLONE ACETONIDE 10 MG/ML IJ SUSP
10.0000 mg | Freq: Once | INTRAMUSCULAR | Status: AC
  Administered 2016-05-06: 10 mg

## 2016-05-06 NOTE — Progress Notes (Signed)
Subjective:     Patient ID: Summer Brown, female   DOB: [birth year], 61 y.o.   MRN: [id number]  HPI patient presents stating her heel has really been throbbing over the last couple months and it has gotten worse but it seems to be more in the middle in the outside of the heel   Review of Systems     Objective:   Physical Exam Neurovascular status intact with patient found to have severe discomfort in the central and lateral aspect of the plantar fascia right with fluid buildup    Assessment:     Central and lateral band plantar fasciitis right with moderate medial band still noted    Plan:     H&P condition reviewed and did an injection for and lateral side 3 Milligan Kenalog 5 mill grams Xylocaine and advised on wearing her big boot. Placed on Sterapred DS 12 day Dosepak is that's been effective for her several months ago and advised on aggressive ice therapy and may require shockwave if symptoms persist. Patient has lost her job and does not have to weight-bear as much

## 2016-05-20 ENCOUNTER — Encounter: Payer: Self-pay | Admitting: Podiatry

## 2016-05-20 ENCOUNTER — Ambulatory Visit (INDEPENDENT_AMBULATORY_CARE_PROVIDER_SITE_OTHER): Payer: Self-pay | Admitting: Podiatry

## 2016-05-20 DIAGNOSIS — M722 Plantar fascial fibromatosis: Secondary | ICD-10-CM

## 2016-05-20 MED ORDER — DICLOFENAC SODIUM 75 MG PO TBEC: 75.0000 mg | DELAYED_RELEASE_TABLET | Freq: Two times a day (BID) | ORAL | 2 refills | Status: DC

## 2016-05-20 NOTE — Progress Notes (Signed)
Subjective:    Patient ID: Summer Brown, female   DOB: 61 y.o.   MRN: [id number]   HPI patient presents stating she still having a lot of pain in the middle and outside of her right heel and while it's improved some but still is bothering her and states that it seems to be worse after periods of sitting and when getting up in the morning    ROS      Objective:  Physical Exam Continued inflammatory fasciitis right central and lateral heel around the insertion calcaneus    Assessment:    Continued mid band and lateral band plantar fasciitis right     Plan:     H&P condition reviewed and I've recommended night splint in order to try to stretch while sleeping and sitting. I then gave instructions for aggressive ice therapy and placed on diclofenac 75 mg twice a day and will be seen back to recheck again 4 weeks or earlier if needed

## 2016-06-01 IMAGING — CR DG WRIST COMPLETE 3+V*R*
1 series · 4 of 4 positions shown · non-contrast
Comparison: None.

CLINICAL DATA: Fall, and tripped.  Injury today.  Initial counter

EXAM:
RIGHT WRIST - COMPLETE 3+ VIEW

[Series 1: dg wrist complete right · 0.14mm/px · 4 of 4 slices shown]
[im 1/4]
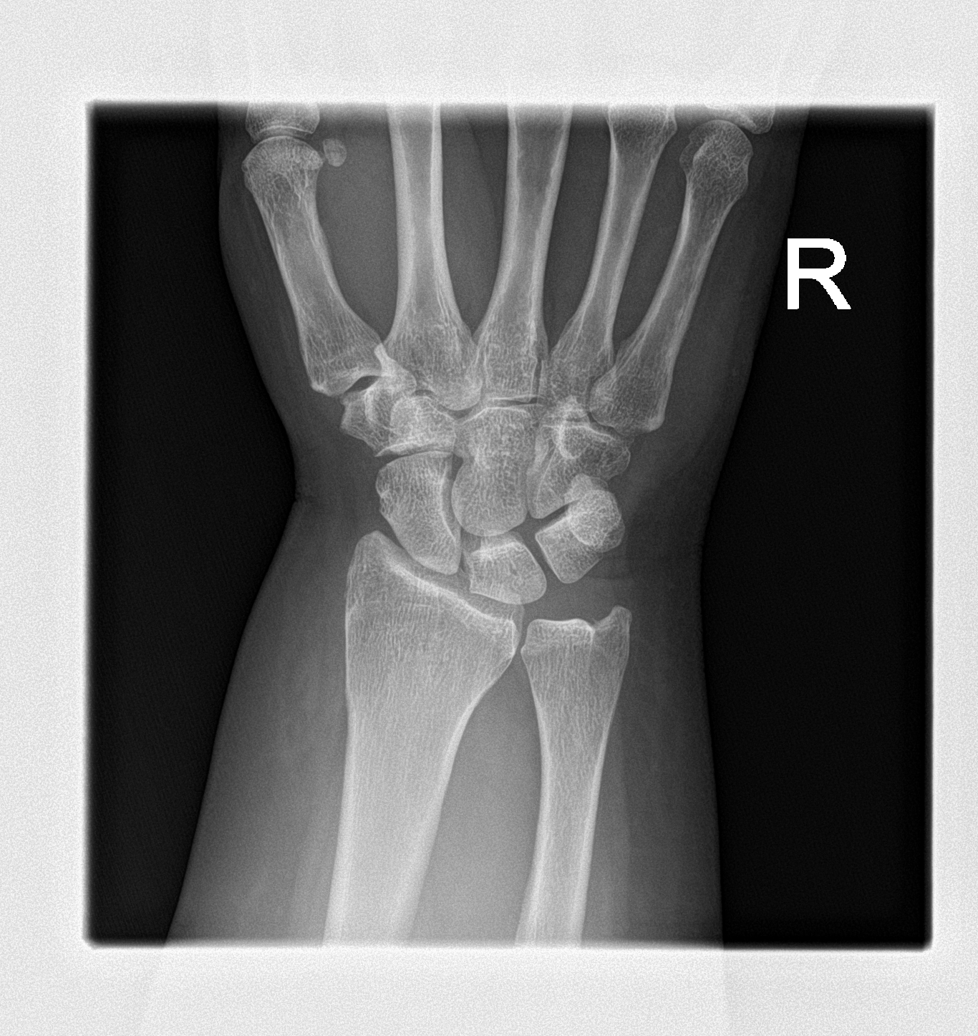
[im 2/4]
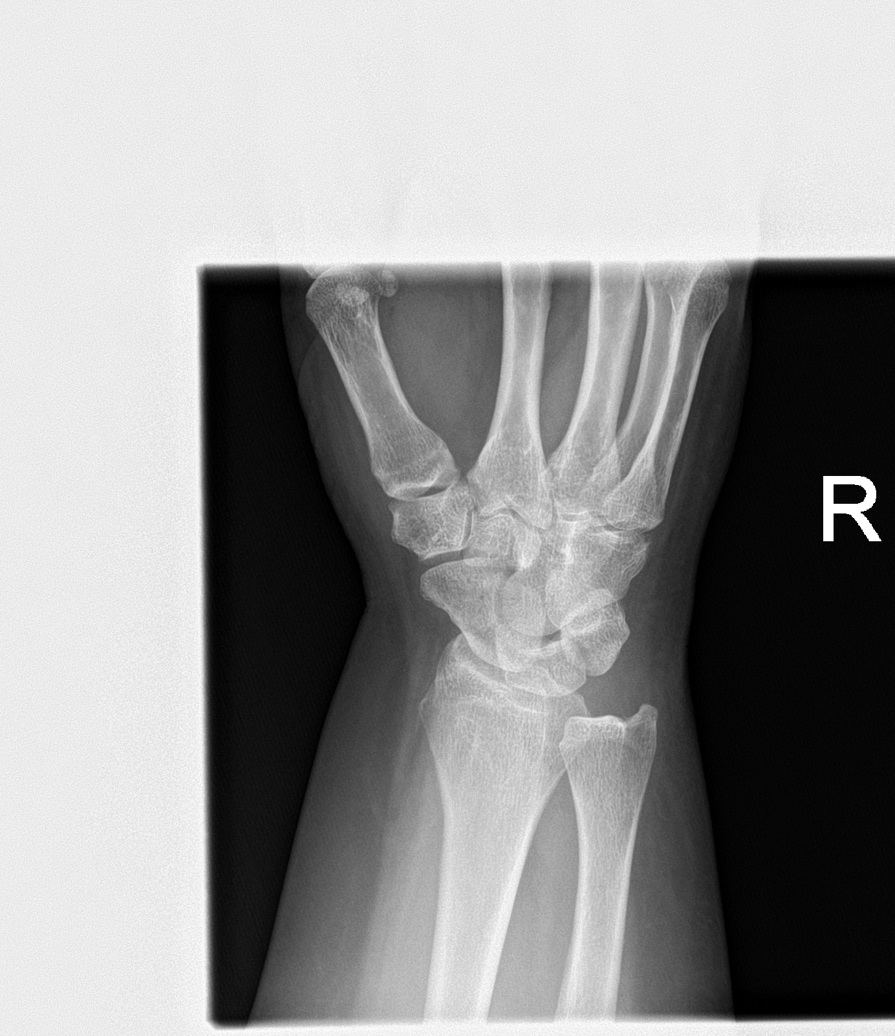
[im 3/4]
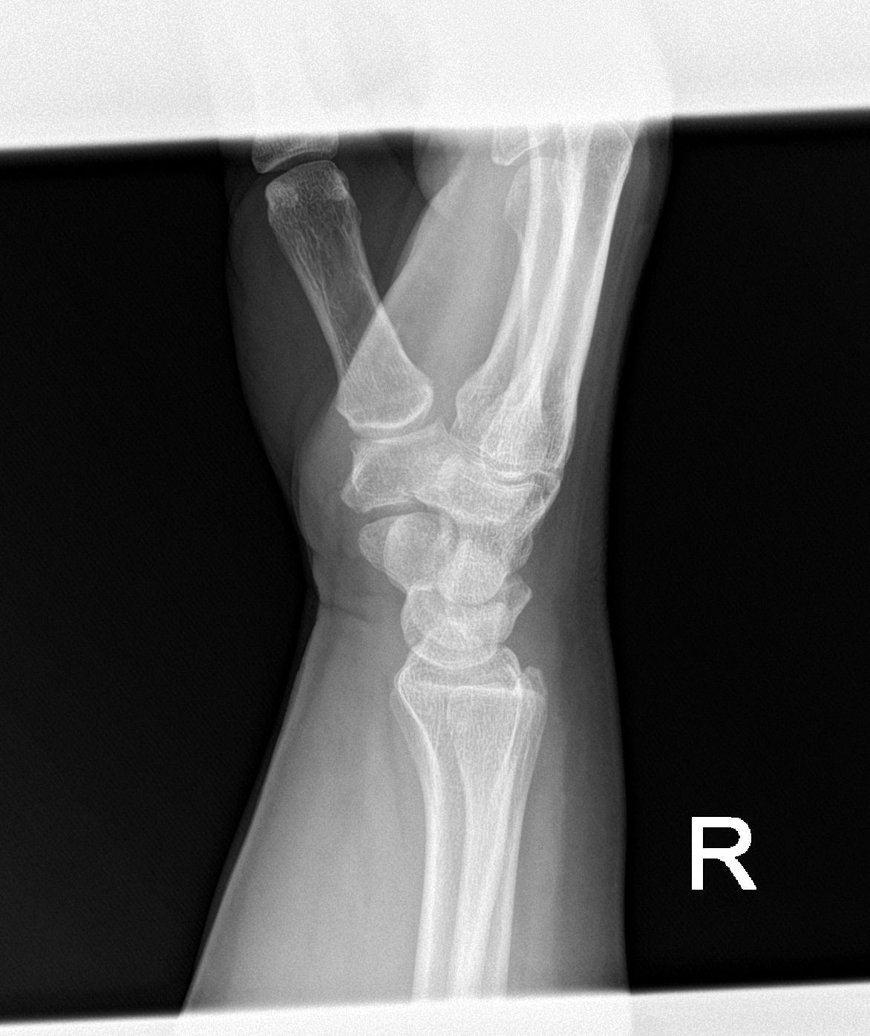
[im 4/4]
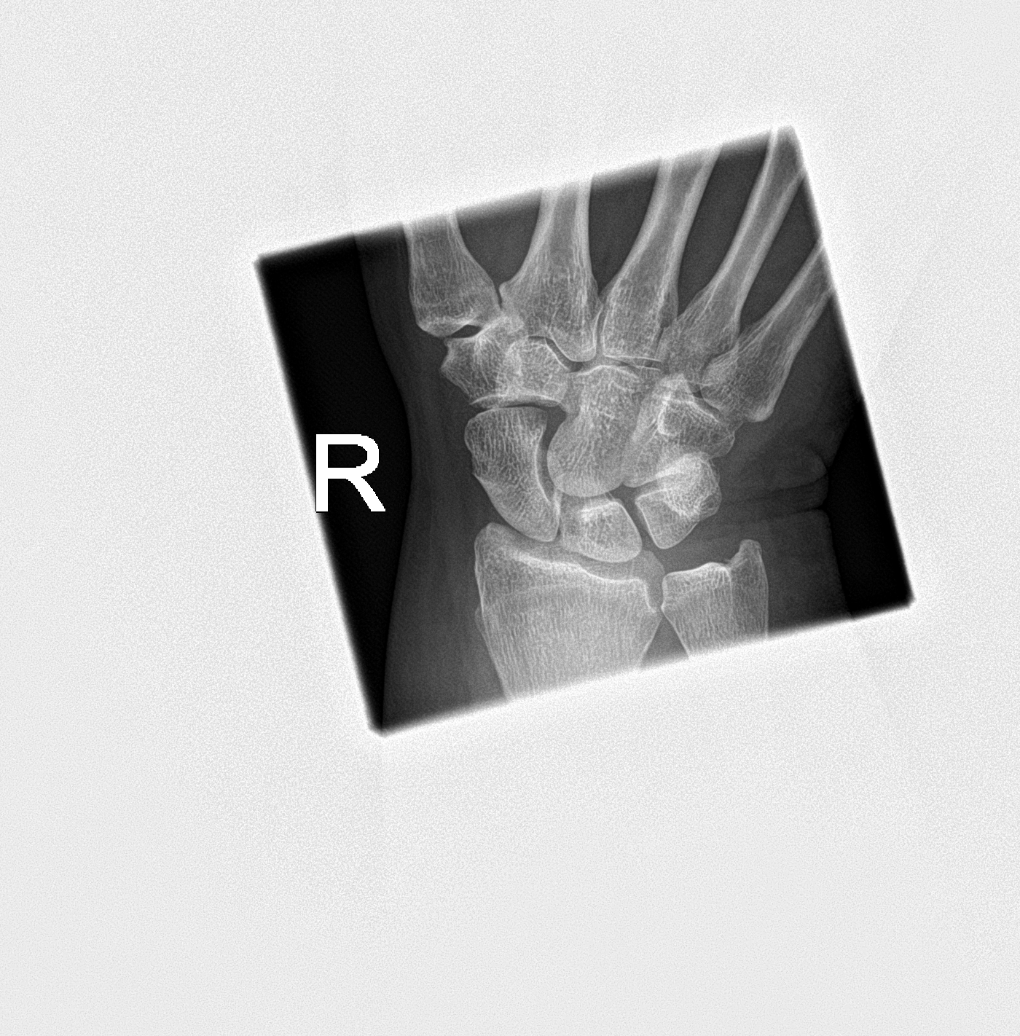

[4 of 4 positions shown; findings below may reference images not displayed]

FINDINGS: No distal radius or ulnar fracture. Radiocarpal joint is intact. No
carpal fracture. No soft tissue abnormality.
IMPRESSION: No fracture or dislocation.

## 2016-06-01 IMAGING — CT CT HEAD W/O CM
1 series · 16 of 30 positions shown, 20 images · non-contrast
Comparison: None.

CLINICAL DATA: Fall

EXAM:
CT HEAD WITHOUT CONTRAST
TECHNIQUE: Contiguous axial images were obtained from the base of the skull
through the vertex without intravenous contrast.

[Series 2: head wo · axial · 0.40mm/px · z∈[-132,+12]mm · 16 of 36 slices shown, 20 images]
[im 2/36  brain]
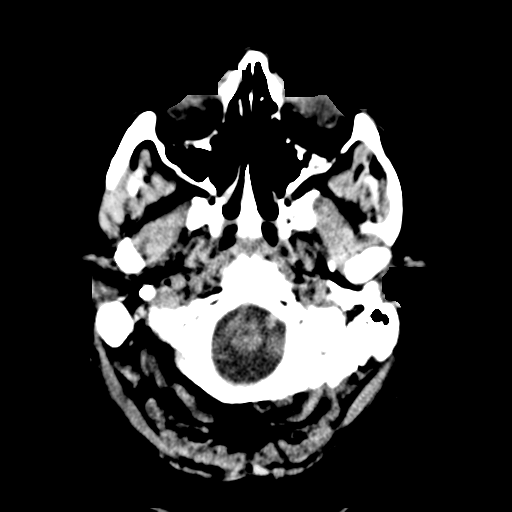
[im 2/36  bone]
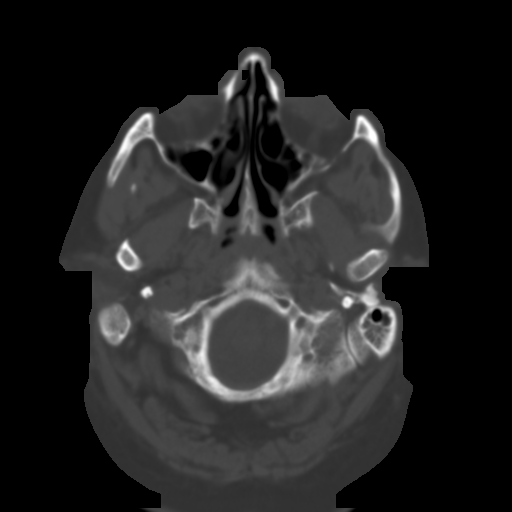
[im 4/36  brain]
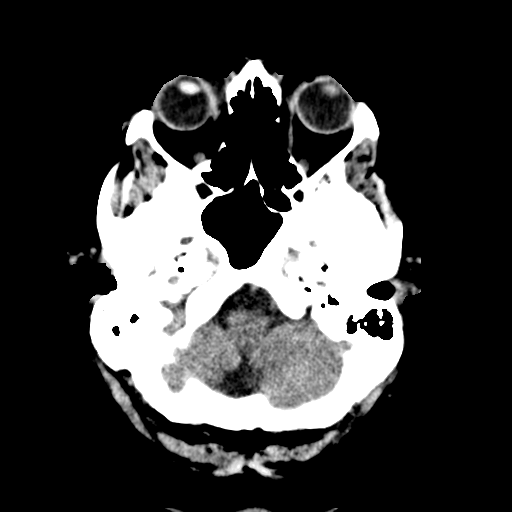
[im 7/36  brain]
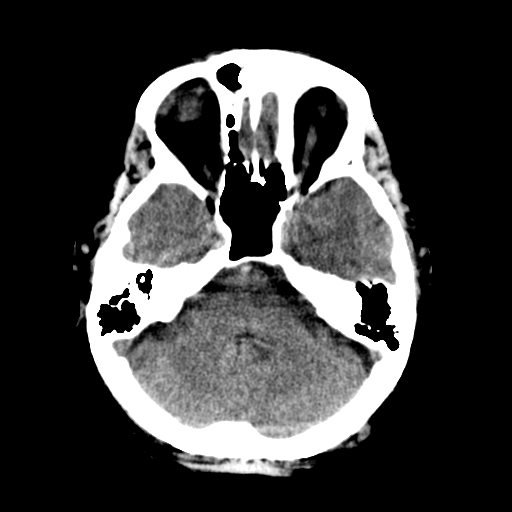
[im 9/36  brain]
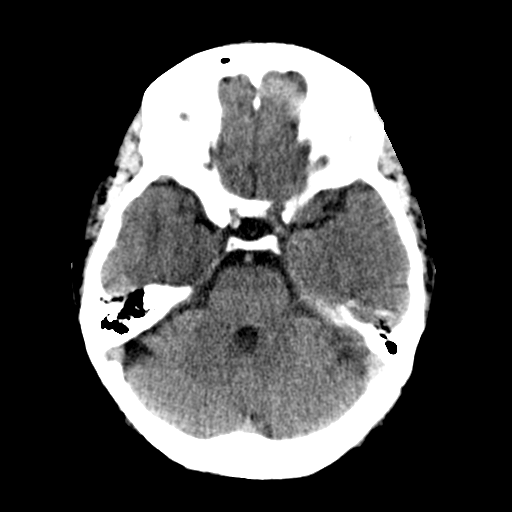
[im 10/36  brain]
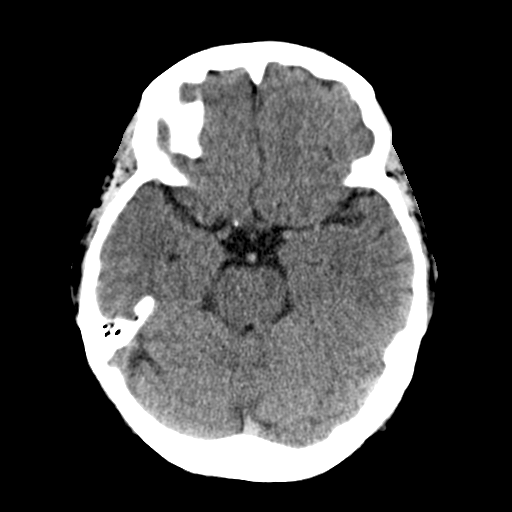
[im 10/36  bone]
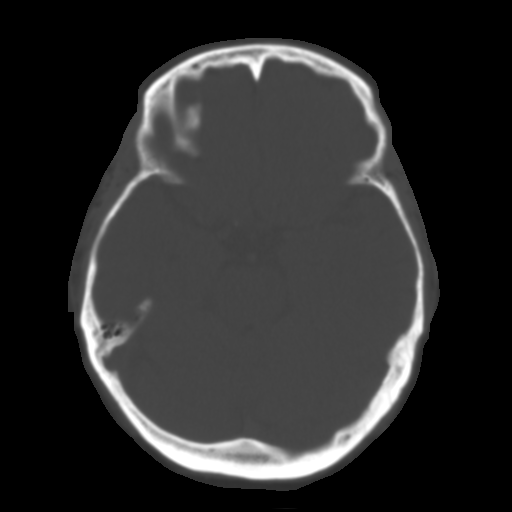
[im 13/36  brain]
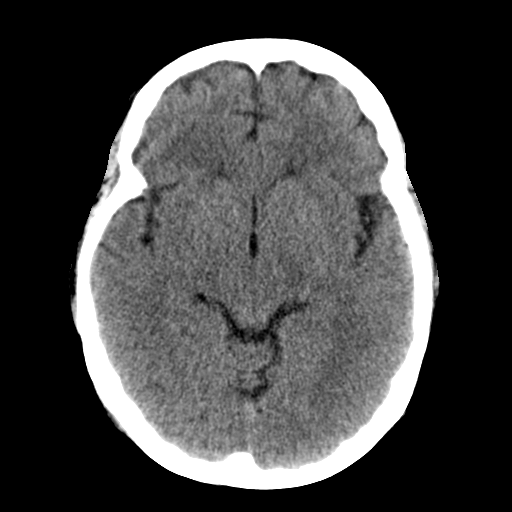
[im 15/36  brain]
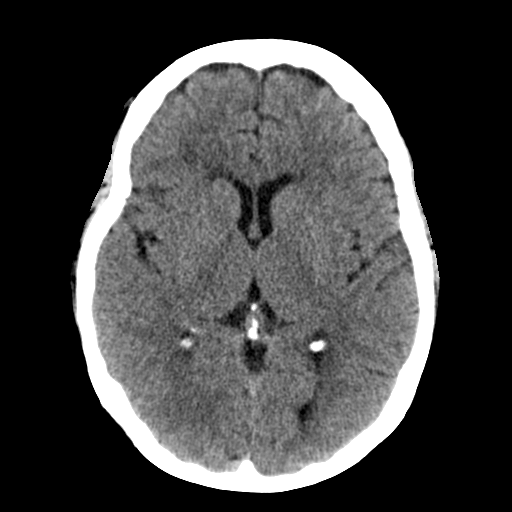
[im 17/36  brain]
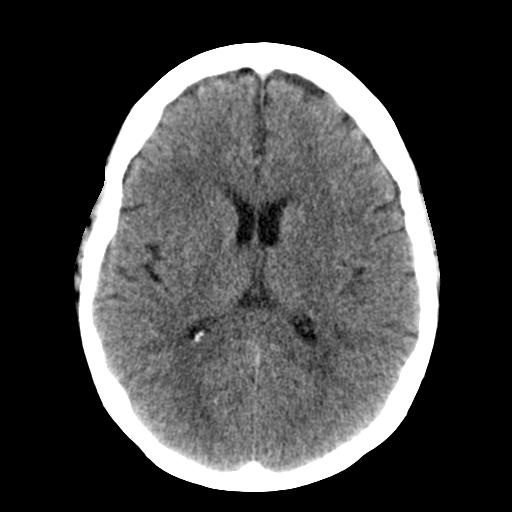
[im 19/36  brain]
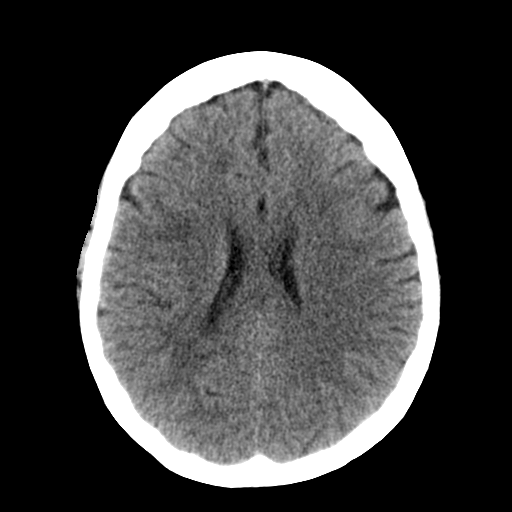
[im 19/36  bone]
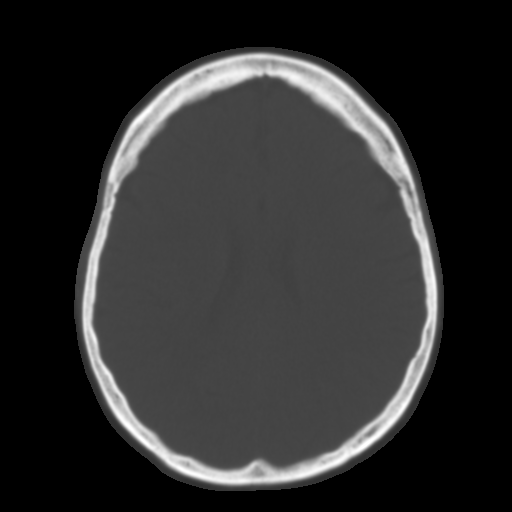
[im 21/36  brain]
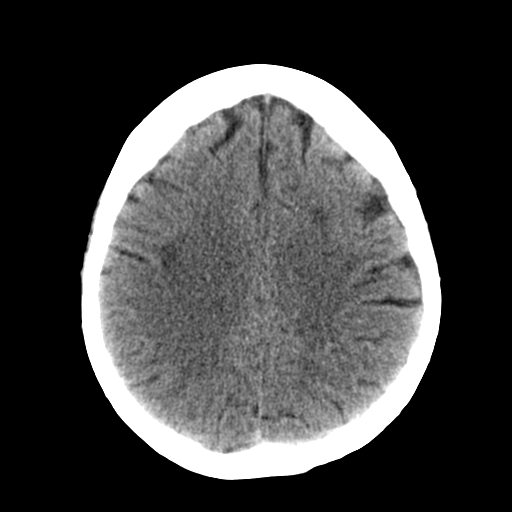
[im 23/36  brain]
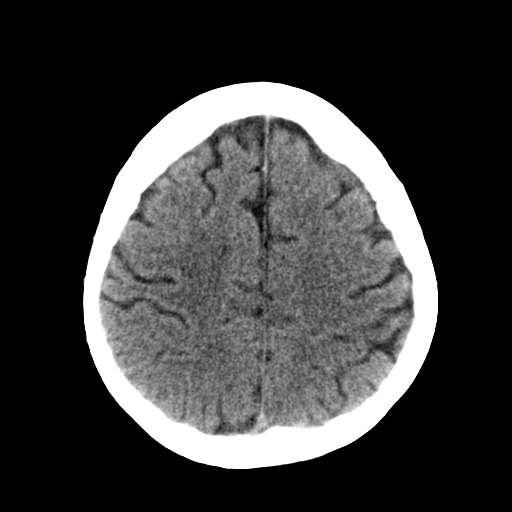
[im 26/36  brain]
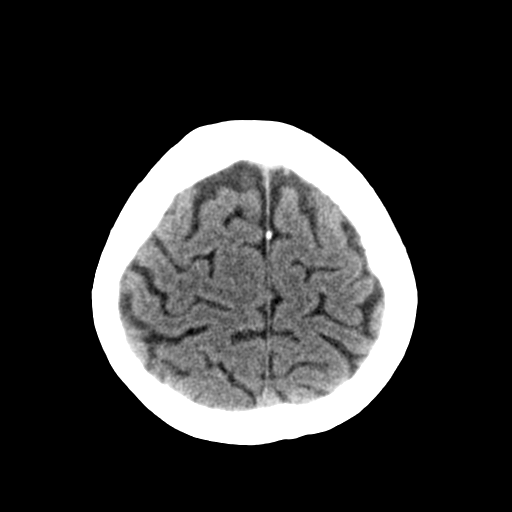
[im 27/36  brain]
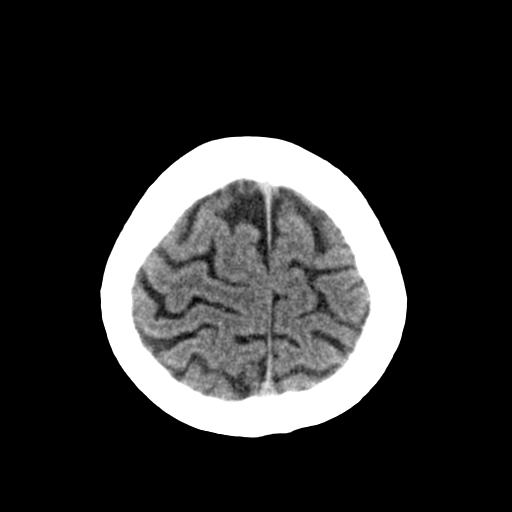
[im 27/36  bone]
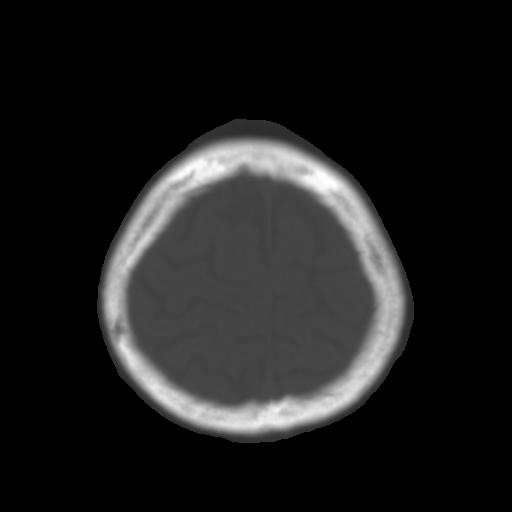
[im 29/36  brain]
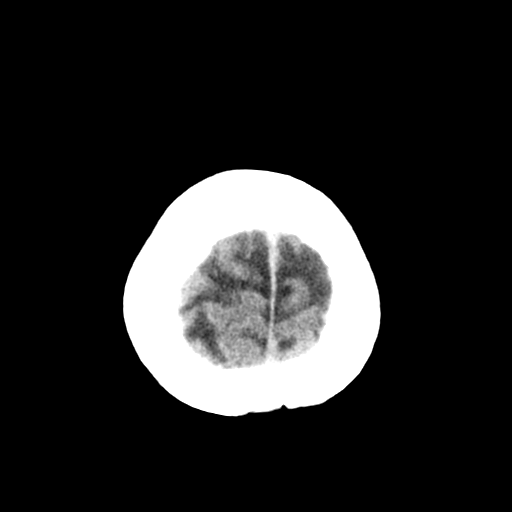
[im 32/36  brain]
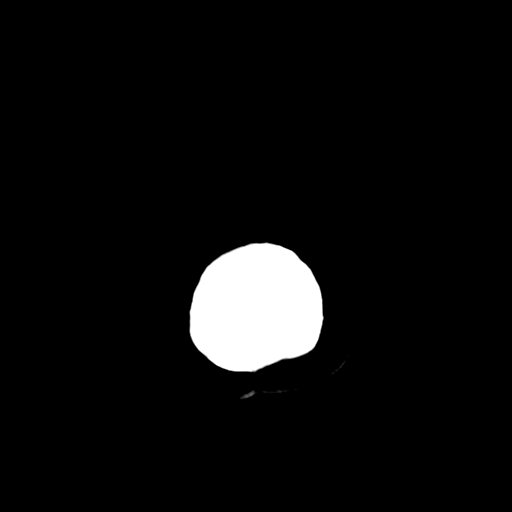
[im 34/36  brain]
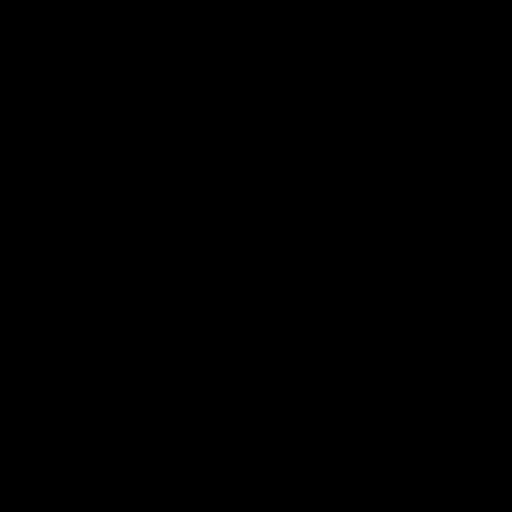

[16 of 30 positions shown; findings below may reference images not displayed]

FINDINGS: Ventricle size normal. Patchy hypodensity in the frontal white
matter bilaterally appears chronic. No acute infarct. Negative for
hemorrhage or mass.

Negative for skull fracture.
IMPRESSION: Chronic microvascular ischemic change in the white matter. No acute
abnormality.

## 2016-06-17 ENCOUNTER — Ambulatory Visit: Payer: Self-pay | Admitting: Podiatry

## 2016-11-01 ENCOUNTER — Ambulatory Visit (INDEPENDENT_AMBULATORY_CARE_PROVIDER_SITE_OTHER): Payer: Self-pay | Admitting: Family Medicine

## 2016-11-01 ENCOUNTER — Encounter: Payer: Self-pay | Admitting: Family Medicine

## 2016-11-01 VITALS — BP 104/74 | HR 92 | Temp 98.0°F | Ht 68.5 in | Wt 288.4 lb

## 2016-11-01 DIAGNOSIS — Z1211 Encounter for screening for malignant neoplasm of colon: Secondary | ICD-10-CM

## 2016-11-01 DIAGNOSIS — R6889 Other general symptoms and signs: Secondary | ICD-10-CM

## 2016-11-01 DIAGNOSIS — R634 Abnormal weight loss: Secondary | ICD-10-CM

## 2016-11-01 LAB — CBC WITH DIFFERENTIAL/PLATELET
BASOS PCT: 0.5 % (ref 0.0–3.0)
Basophils Absolute: 0 10*3/uL (ref 0.0–0.1)
EOS PCT: 4.9 % (ref 0.0–5.0)
Eosinophils Absolute: 0.3 10*3/uL (ref 0.0–0.7)
HCT: 43.6 % (ref 36.0–46.0)
Hemoglobin: 14.8 g/dL (ref 12.0–15.0)
LYMPHS ABS: 2.9 10*3/uL (ref 0.7–4.0)
Lymphocytes Relative: 45.5 % (ref 12.0–46.0)
MCHC: 34 g/dL (ref 30.0–36.0)
MCV: 92.3 fl (ref 78.0–100.0)
MONOS PCT: 7 % (ref 3.0–12.0)
Monocytes Absolute: 0.5 10*3/uL (ref 0.1–1.0)
NEUTROS ABS: 2.7 10*3/uL (ref 1.4–7.7)
NEUTROS PCT: 42.1 % — AB (ref 43.0–77.0)
PLATELETS: 263 10*3/uL (ref 150.0–400.0)
RBC: 4.72 Mil/uL (ref 3.87–5.11)
RDW: 13.3 % (ref 11.5–15.5)
WBC: 6.5 10*3/uL (ref 4.0–10.5)

## 2016-11-01 LAB — COMPREHENSIVE METABOLIC PANEL
ALT: 28 U/L (ref 0–35)
AST: 19 U/L (ref 0–37)
Albumin: 4.2 g/dL (ref 3.5–5.2)
Alkaline Phosphatase: 66 U/L (ref 39–117)
BILIRUBIN TOTAL: 0.4 mg/dL (ref 0.2–1.2)
BUN: 19 mg/dL (ref 6–23)
CALCIUM: 9.5 mg/dL (ref 8.4–10.5)
CHLORIDE: 102 meq/L (ref 96–112)
CO2: 28 meq/L (ref 19–32)
Creatinine, Ser: 0.72 mg/dL (ref 0.40–1.20)
GFR: 87.16 mL/min (ref >60.00)
Glucose, Bld: 168 mg/dL — ABNORMAL HIGH (ref 70–99)
Potassium: 4.5 mEq/L (ref 3.5–5.1)
Sodium: 138 mEq/L (ref 135–145)
Total Protein: 7.7 g/dL (ref 6.0–8.3)

## 2016-11-01 LAB — HEMOGLOBIN A1C: Hgb A1c MFr Bld: 7.1 % — ABNORMAL HIGH (ref 4.6–6.5)

## 2016-11-01 LAB — T4, FREE: Free T4: 0.97 ng/dL (ref 0.60–1.60)

## 2016-11-01 LAB — TSH: TSH: 2.43 u[IU]/mL (ref 0.35–4.50)

## 2016-11-01 NOTE — Addendum Note (Signed)
Addended by: Mady Haagensen on: 11/01/2016 01:43 PM   Modules accepted: Orders

## 2016-11-01 NOTE — Progress Notes (Signed)
Subjective:   Patient ID: Summer Brown, female    DOB: 06-Jan-1956, 61 y.o.   MRN: [id number]  Summer Brown is a pleasant 61 y.o. year old female who presents to clinic today with Chills (Patient is here today C/O feeling cold for 62-3 months now.  Everyone in the room will feel normal and she is freezing.  Also states that one day her right leg had pain in it for no reason but then states that she fell a while back.  She said that she had to cancel the appt with her back doctor for this appt because this appt was more important.  She has lost quite a bit of weight since starting work.)  on 11/01/2016  HPI: Pt is a patient of Dr. Lorelei Pont, new to me.  Feels she is always the "coldest person in the room" for past 2- 3 months.  Has lost weight since starting back at work. Has had no change in appetite.  Walking more at work.  One day her right leg hurt burt that has resolved.  Has never had a colonoscopy.  Not willing to have one. No blood in her stool or changes in her bowel habits.  Lab Results  Component Value Date   TSH 3.16 08/07/2014   Wt Readings from Last 3 Encounters:  11/01/16 288 lb 6.4 oz (130.8 kg)  12/24/15 (!) 310 lb (140.6 kg)  11/03/15 (!) 324 lb 12 oz (147.3 kg)   No current outpatient prescriptions on file prior to visit.   No current facility-administered medications on file prior to visit.     Allergies  Allergen Reactions  . Codeine Itching    Past Medical History:  Diagnosis Date  . Chronic back pain 12/12/2013  . Ulcer     Past Surgical History:  Procedure Laterality Date  . BACK SURGERY    . FRACTURE SURGERY    . NOSE SURGERY    . OVARIAN CYST SURGERY    . SPINE SURGERY      Family History  Problem Relation Age of Onset  . Diabetes Father     Social History   Social History  . Marital status: Single    Spouse name: N/A  . Number of children: N/A  . Years of education: N/A   Occupational History  .  Other    [employer]    Social History Main Topics  . Smoking status: Never Smoker  . Smokeless tobacco: Never Used  . Alcohol use Yes     Comment: rare  . Drug use: No  . Sexual activity: Not on file   Other Topics Concern  . 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   Review of Systems  Constitutional: Positive for fatigue and unexpected weight change. Negative for fever.  HENT: Negative.   Eyes: Negative.   Respiratory: Negative.   Cardiovascular: Negative.   Gastrointestinal: Negative.   Endocrine: Positive for cold intolerance. Negative for heat intolerance, polydipsia, polyphagia and polyuria.  Musculoskeletal: Negative.   Allergic/Immunologic: Negative.   Neurological: Negative.   Hematological: Negative.   Psychiatric/Behavioral: Negative.   All other systems reviewed and are negative.      Objective:    BP 104/74 (BP Location: Left Arm, Patient Position: Sitting, Cuff Size: Large)   Pulse  92   Temp 98 F (36.7 C) (Oral)   Ht 5' 8.5" (1.74 m)   Wt 288 lb 6.4 oz (130.8 kg)   SpO2 96%   BMI 43.21 kg/m    Physical Exam  Constitutional: She is oriented to person, place, and time. She appears well-developed and well-nourished. No distress.  HENT:  Head: Normocephalic and atraumatic.  Eyes: Conjunctivae are normal.  Cardiovascular: Normal rate and regular rhythm.   Pulmonary/Chest: Effort normal and breath sounds normal.  Abdominal: Soft.  Musculoskeletal: Normal range of motion. She exhibits no edema.  Neurological: She is alert and oriented to person, place, and time. No cranial nerve deficit.  Skin: Skin is warm and dry. She is not diaphoretic.  Psychiatric: She has a normal mood and affect. Her behavior is normal. Judgment and thought content normal.  Nursing note and vitals reviewed.          Assessment & Plan:   Cold intolerance - Plan: TSH, T4, Free, CBC with Differential/Platelet, Hemoglobin A1c, Comprehensive metabolic panel  Unintentional weight loss - Plan: TSH, T4, Free, CBC with Differential/Platelet, Hemoglobin A1c, Comprehensive metabolic panel No Follow-up on file.

## 2016-11-01 NOTE — Assessment & Plan Note (Signed)
With cold intolerance- differential is wide at this point. Advised pt that we start with blood work, IFOB today. The patient indicates understanding of these issues and agrees with the plan. Orders Placed This Encounter  Procedures  . Fecal occult blood, imunochemical  . TSH  . T4, Free  . CBC with Differential/Platelet  . Hemoglobin A1c  . Comprehensive metabolic panel

## 2016-11-05 ENCOUNTER — Encounter: Payer: Self-pay | Admitting: Family Medicine

## 2016-11-05 ENCOUNTER — Ambulatory Visit (INDEPENDENT_AMBULATORY_CARE_PROVIDER_SITE_OTHER): Payer: Self-pay | Admitting: Family Medicine

## 2016-11-05 DIAGNOSIS — E119 Type 2 diabetes mellitus without complications: Secondary | ICD-10-CM

## 2016-11-05 MED ORDER — METFORMIN HCL ER 500 MG PO TB24: 500.0000 mg | ORAL_TABLET | Freq: Every day | ORAL | 1 refills | Status: DC

## 2016-11-05 NOTE — Patient Instructions (Signed)

## 2016-11-05 NOTE — Progress Notes (Signed)
Dr. Frederico Hamman T. Consuelo Suthers, MD, [facility] Primary [facility] and Sports Medicine [place], [address] Phone: [phone] Fax: [phone]  11/05/2016  Patient: Summer Brown, MRN: [id number], DOB: 10/29/1955, 61 y.o.  Primary Physician:  Owens Loffler, MD   Chief Complaint  Patient presents with  . Diabetes    New Onset. Discuss options for treatment.   Subjective:   Summer Brown is a 61 y.o. very pleasant female patient who presents with the following:  New onset DM:  Controlled type 2 diabetes mellitus without complication, without long-term current use of insulin (HCC)  >10 minutes spent in face to face time with patient, >50% spent in counselling or coordination of care   We talked primarily about new-onset diabetes, its implications, the pathophysiology of diabetes.  We also discussed diet, management of diabetes 5 diet as well as exercise.  She does have an elevated A1c, so we're going go ahead and start her on one tablet of metformin extended release daily with close follow-up.  She indicated she understood everything we talked about common is going to start working on this as soon as possible.  Lab Results  Component Value Date   HGBA1C 7.1 (H) 11/01/2016      Signed,  Kalem Rockwell T. Ajanee Buren, MD  Patient Instructions   [facility] Low Glycemic Diet (Source: [facility], 2006)  Low Glycemic Foods (20-49) (Decrease risk of developing heart disease)  Best for Diabetes: Eat Mostly these  Breakfast Cereals: All-Bran All-Bran Fruit 'n Oats Fiber One Oatmeal (not instant) Oat bran  Fruits and fruit juices: (Limit to 1-2 servings per day) Apples Apricots (fresh & dried) Blackberries Blueberries Cherries Cranberries Peaches Pears Plums Prunes Grapefruit Raspberries Strawberries Tangerine  Juices: Apple juice Grapefruit juice Tomato juice  Beans and legumes (fresh-cooked): Black-eyed peas Butter  beans Chick peas Lentils  Green beans Lima beans Kidney beans Navy beans Pinto beans Snow peas  Non-starchy vegetables: Asparagus, avocado, broccoli, cabbage, cauliflower, celery, cucumber, greens, lettuce, mushrooms, peppers, tomatoes, okra, onions, spinach, summer squash  Grains: Barley Bulgur Rye Wild rice  Nuts and oils : Almonds Peanuts Sunflower seeds Hazelnuts Pecans Walnuts Oils that are liquid at room temperature  Dairy, fish, meat, soy, and eggs: Milk, skim Lowfat cheese Yogurt, lowfat, fruit sugar sweetened Lean red meat Fish  Skinless chicken & [place] Shellfish Egg whites (up to 3 daily) Soy products  Egg yolks (up to 7 or _____ per week) Moderate Glycemic Foods (50-69)  OK sometimes with diabetes  Breakfast Cereals: Bran Buds Bran Chex Just Right Mini-Wheats  Special K Swiss muesli  Fruits: Banana (under-ripe) Dates Figs Grapes Kiwi Mango Oranges Raisins  Fruit Juices: Cranberry juice Orange juice  Beans and legumes: [place]-type baked beans Canned pinto, kidney, or navy beans Green peas  Vegetables: Beets Carrots  Sweet potato Yam Corn on the cob  Breads: Pita (pocket) bread Oat bran bread Pumpernickel bread Rye bread Wheat bread, high fiber   Grains: Cornmeal Rice, brown Rice, white Couscous  Pasta: Macaroni Pizza, cheese Ravioli, meat filled Spaghetti, white   Nuts: Cashews Macadamia  Snacks: Chocolate Ice cream, lowfat Muffin Popcorn High Glycemic Foods (70-100)  Rare: Eat occaisionally with diabetes  THESE ARE THE WORST KIND OF FOODS FOR YOUR DIABETES  Breakfast Cereals: Cheerios Corn Chex Corn Flakes Cream of Wheat Grape Nuts Grape Nut Flakes Grits Nutri-Grain Puffed Rice Puffed Wheat Rice Chex Rice Krispies Shredded Wheat Team Total  Fruits: Pineapple Watermelon Banana (over-ripe)  Beverages: Sodas, sweet tea, pineapple juice  Vegetables: Potato, baked, boiled, fried, mashed [place] fries Canned or frozen  corn Parsnips Winter squash  Breads: Most breads (white and whole grain) Bagels Bread sticks Bread stuffing Kaiser roll Dinner rolls  Grains: Rice, instant Tapioca, with milk Candy and most cookies  Snacks: Donuts Corn chips Jelly beans Pretzels Pastries        Allergies as of 11/05/2016      Reactions   Codeine Itching      Medication List       Accurate as of 11/05/16 11:59 PM. Always use your most recent med list.          metFORMIN 500 MG 24 hr tablet Commonly known as:  GLUCOPHAGE-XR Take 1 tablet (500 mg total) by mouth daily with breakfast.

## 2016-11-06 ENCOUNTER — Encounter: Payer: Self-pay | Admitting: Family Medicine

## 2016-11-06 DIAGNOSIS — E119 Type 2 diabetes mellitus without complications: Secondary | ICD-10-CM

## 2016-11-06 DIAGNOSIS — E11618 Type 2 diabetes mellitus with other diabetic arthropathy: Secondary | ICD-10-CM | POA: Insufficient documentation

## 2016-11-06 HISTORY — DX: Type 2 diabetes mellitus without complications: E11.9

## 2016-11-22 ENCOUNTER — Ambulatory Visit: Payer: Self-pay | Admitting: *Deleted

## 2016-11-22 ENCOUNTER — Telehealth: Payer: Self-pay | Admitting: Family Medicine

## 2016-11-22 NOTE — Telephone Encounter (Signed)
Yesterday morning she felt her heart rushing and beating hard.  "I got dizzy and sweaty"  I couldn't stand up.  I felt a fluttering in my chest.  Denied shortness of breath.  Did have some nausea.  Today she went with someone to [employer] and while there she got unsteady and dropped her tea.   Her companion said she  "was leaning" when this happened.  I encouraged pt that she needed to be seen due to the seriousness of her symptoms.   Due to financial reasons she decided not to go to the doctor's appt I made for her.   I cancelled the appt.   I emphasized the importance of being evaluated.   She decided not to be evaluated at this time.   I instructed her to call back if any of her symptoms recurred or got worse according to the protocol.   She stated,  "I know what symptoms to look for"   My boyfriend is a paramedic with the fire department.    She assured me she would call 911 if she experiences any of the symptoms again.    Reason for Disposition . [1] MODERATE dizziness (e.g., vertigo; feels very unsteady, interferes with normal activities) AND [2] has NOT been evaluated by physician for this  Answer Assessment - Initial Assessment Questions 1. DESCRIPTION: "Describe your dizziness."     Felt a flutter in my chest and my heart beating real hard.  I got dizzy.    2. VERTIGO: "Do you feel like either you or the room is spinning or tilting?"      Yes when it happened.  Happened yesterday morning. 3. LIGHTHEADED: "Do you feel lightheaded?" (e.g., somewhat faint, woozy, weak upon standing)     I did yesterday morning.   I feel tired now 4. SEVERITY: "How bad is it?"  "Can you walk?"   - MILD - Feels unsteady but walking normally.   - MODERATE - Feels very unsteady when walking, but not falling; interferes with normal activities (e.g., school, work) .   - SEVERE - Unable to walk without falling (requires assistance).     No dizziness today. 5. ONSET:  "When did the dizziness begin?"  Yesterday morning. 6. AGGRAVATING FACTORS: "Does anything make it worse?" (e.g., standing, change in head position)     Stress from my job.   My boss is very hard to work with.  Bossing me around and yelling at me. 7. CAUSE: "What do you think is causing the dizziness?"     Stress from my job. 8. RECURRENT SYMPTOM: "Have you had dizziness before?" If so, ask: "When was the last time?" "What happened that time?"     I took Sudaphed last year and it made me feel this way. 9. OTHER SYMPTOMS: "Do you have any other symptoms?" (e.g., headache, weakness, numbness, vomiting, earache)     Weakness.   Felt nausea and tight yesterday morning. 10. PREGNANCY: "Is there any chance you are pregnant?" "When was your last menstrual period?"       No  Protocols used: DIZZINESS - VERTIGO-A-AH

## 2016-11-22 NOTE — Telephone Encounter (Signed)
Copied from [place] [id number]. Topic: Quick Communication - See Telephone Encounter >> Nov 22, 2016  3:02 PM Cleaster Corin, [place] wrote: CRM for notification. See Telephone encounter for:   11/22/16. Pt. Has questions about med. (metformin) And will like to speak with nurse  Lightheaded and dizzness

## 2016-11-24 ENCOUNTER — Ambulatory Visit: Payer: Self-pay | Admitting: Family Medicine

## 2017-03-25 ENCOUNTER — Ambulatory Visit (INDEPENDENT_AMBULATORY_CARE_PROVIDER_SITE_OTHER): Payer: Self-pay | Admitting: Family Medicine

## 2017-03-25 ENCOUNTER — Encounter: Payer: Self-pay | Admitting: Family Medicine

## 2017-03-25 VITALS — BP 136/78 | HR 84 | Temp 97.9°F | Wt 290.0 lb

## 2017-03-25 DIAGNOSIS — M25562 Pain in left knee: Secondary | ICD-10-CM

## 2017-03-25 MED ORDER — NAPROXEN 500 MG PO TABS: ORAL_TABLET | ORAL | 0 refills | Status: DC

## 2017-03-25 NOTE — Progress Notes (Signed)
BP 136/78 (BP Location: Left Arm, Patient Position: Sitting, Cuff Size: Large)   Pulse 84   Temp 97.9 F (36.6 C) (Oral)   Wt 290 lb (131.5 kg)   SpO2 98%   BMI 43.45 kg/m    CC: L knee pain Subjective:    Patient ID: Summer Brown, female    DOB: 12/20/1955, 62 y.o.   MRN: [id number]  HPI: Summer Brown is a 62 y.o. female presenting on 03/25/2017 for Knee Pain (Left knee pain started about 4 days ago and is worsening. Pain is worse at night with leg extended. [place] about 2 mos ago but no pain at that time. Tried Aleve, ibuprofen and heating pad which helps a little. )   4d h/o L knee pain that is progressively worsening. Worse pain at night with leg in extension and when knees touch. Describes medial pain with radiation throughout entire knee. Feels swollen. No fever, redness, or warmth  She did have a fall 2 months ago, but no residual pain with this. Tripped while walking into back porch, landed on both knees.  Has tried aleve, ibuprofen, old narcotic pain med with some relief, heating pad.  Stands at work all day Therapist, music. No prior knee surgeries.   Relevant past medical, surgical, family and social history reviewed and updated as indicated. Interim medical history since our last visit reviewed. Allergies and medications reviewed and updated. Outpatient Medications Prior to Visit  Medication Sig Dispense Refill  . metFORMIN (GLUCOPHAGE-XR) 500 MG 24 hr tablet Take 1 tablet (500 mg total) by mouth daily with breakfast. 90 tablet 1   No facility-administered medications prior to visit.      Per HPI unless specifically indicated in ROS section below Review of Systems     Objective:    BP 136/78 (BP Location: Left Arm, Patient Position: Sitting, Cuff Size: Large)   Pulse 84   Temp 97.9 F (36.6 C) (Oral)   Wt 290 lb (131.5 kg)   SpO2 98%   BMI 43.45 kg/m   Wt Readings from Last 3 Encounters:  03/25/17 290 lb (131.5 kg)  11/01/16 288 lb 6.4 oz (130.8 kg)    12/24/15 (!) 310 lb (140.6 kg)    Physical Exam  Constitutional: She appears well-developed and well-nourished. No distress.  Musculoskeletal: She exhibits no edema.  R knee - tender to palpation at pes anserine bursa otherwise normal. L Knee exam: No deformity on inspection. ++ marked pain with palpation of lateral, medial knee joint lines, pes anserine bursa Some swelling noted. FROM in flex/extension with some crepitus, tender at full extension + popliteal pain and fullness. Neg drawer test. Tender with mcmurray test. No pain with valgus/varus stress. No PFgrind. No abnormal patellar mobility.   Skin: Skin is warm and dry. No rash noted. No erythema.  Nursing note and vitals reviewed.  Lab Results  Component Value Date   CREATININE 0.72 11/01/2016   BUN 19 11/01/2016   NA 138 11/01/2016   K 4.5 11/01/2016   CL 102 11/01/2016   CO2 28 11/01/2016      Assessment & Plan:   Problem List Items Addressed This Visit    Acute pain of left knee - Primary    She has L>R pes anserine bursitis but also with evidence for internal derangement such as osteoarthritis flare or possible meniscal tear after recent fall. rec supportive care with NSAIDs, ice, rest, knee sleeve brace. Exercises for knee bursitis provided today. If not improved with  this, rec return with PCP for further eval. Pt agrees with plan.  Self pay patient - states would not be able to have MRI or surgery.           Meds ordered this encounter  Medications  . naproxen (NAPROSYN) 500 MG tablet    Sig: Take one po bid x 1 week then prn pain, take with food    Dispense:  50 tablet    Refill:  0   No orders of the defined types were placed in this encounter.   Follow up plan: Return if symptoms worsen or fail to improve.  Ria Bush, MD

## 2017-03-25 NOTE — Assessment & Plan Note (Addendum)
She has L>R pes anserine bursitis but also with evidence for internal derangement such as osteoarthritis flare or possible meniscal tear after recent fall. rec supportive care with NSAIDs, ice, rest, knee sleeve brace. Exercises for knee bursitis provided today. If not improved with this, rec return with PCP for further eval. Pt agrees with plan.  Self pay patient - states would not be able to have MRI or surgery.

## 2017-03-25 NOTE — Patient Instructions (Addendum)
You have bursitis of both knees as well as likely worse left knee pain from internal arthritis or possible meniscal injury. Treat with prescription strength anti inflammatory naprosyn 500mg  twice daily with meals for 1-2 wks.  Buy left knee sleeve brace.  Use pillow between knees when sleeping.  Return in 1-2 weeks to see Dr Lorelei Pont if not improved with this.  Handouts on exercises provided today.

## 2018-03-24 ENCOUNTER — Ambulatory Visit: Payer: Self-pay | Admitting: Family Medicine

## 2018-03-24 ENCOUNTER — Ambulatory Visit (INDEPENDENT_AMBULATORY_CARE_PROVIDER_SITE_OTHER)
Admission: RE | Admit: 2018-03-24 | Discharge: 2018-03-24 | Disposition: A | Discharge status: Home / Self Care | Payer: Self-pay | Source: Ambulatory Visit | Attending: Family Medicine | Admitting: Family Medicine

## 2018-03-24 ENCOUNTER — Encounter: Payer: Self-pay | Admitting: Family Medicine

## 2018-03-24 VITALS — BP 110/78 | HR 85 | Temp 97.9°F | Ht 68.5 in | Wt 307.0 lb

## 2018-03-24 DIAGNOSIS — S4991XA Unspecified injury of right shoulder and upper arm, initial encounter: Secondary | ICD-10-CM

## 2018-03-24 DIAGNOSIS — M79601 Pain in right arm: Secondary | ICD-10-CM

## 2018-03-24 DIAGNOSIS — M79641 Pain in right hand: Secondary | ICD-10-CM

## 2018-03-24 NOTE — Patient Instructions (Addendum)
Good to see you today  I will notify you of xray results, in the meantime, keep arm elevated as possible, wear sling during the day, remove at night, Do gentle range of motion with elbow and wrist.   I will notify you of your xray results and any additional follow up that is needed

## 2018-03-24 NOTE — Progress Notes (Signed)
Subjective:    Patient ID: Summer Brown, female    DOB: 1955-12-12, 63 y.o.   MRN: [id number]  HPI This is a 63 yo female who presents today with right arm pain. She hit her left hand on a door frame. Heard a terrible noise. She had immediate pain traveling up arm. Feels numbness and tingling on her hand. Pain with closing hand. Requests xrays.  Took ibuprofen 400 mg this morning. Went to work. 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 Is able to work with one hand. Worked earlier today.   Past Medical History:  Diagnosis Date  . Chronic back pain 12/12/2013  . Controlled type 2 diabetes mellitus without complication, without long-term current use of insulin ([place]) 11/06/2016  . Ulcer    Past Surgical History:  Procedure Laterality Date  . BACK SURGERY    . FRACTURE SURGERY    . NOSE SURGERY    . OVARIAN CYST SURGERY    . SPINE SURGERY     Family History  Problem Relation Age of Onset  . Diabetes Father    Social History   Tobacco Use  . Smoking status: Never Smoker  . Smokeless tobacco: Never Used  Substance Use Topics  . Alcohol use: Yes    Comment: rare  . Drug use: No      Review of Systems Per HPI    Objective:   Physical Exam Vitals signs reviewed.  Constitutional:      General: She is not in acute distress.    Appearance: Normal appearance. She is obese. She is not ill-appearing or toxic-appearing.  Cardiovascular:     Rate and Rhythm: Normal rate.     Heart sounds: Normal heart sounds.  Musculoskeletal:     Right wrist: She exhibits tenderness and bony tenderness. She exhibits normal range of motion.     Right forearm: She exhibits tenderness, bony tenderness, swelling and deformity.     Right hand: She exhibits decreased range of motion, tenderness and swelling. She exhibits normal two-point discrimination and normal capillary refill. Normal strength noted.     Comments: Right hand with puffy fingers, good ROM, strength, normal color, brisk cap refill,  brisk radial pulse.  Wrist with normal flexion, extension.  Right forearm with mild medial swelling distal 1/3. Palpable ridge at area of swelling. Mild erythema.   Skin:    General: Skin is warm and dry.  Neurological:     Mental Status: She is alert.       BP 110/78 (BP Location: Left Arm, Patient Position: Sitting, Cuff Size: Large)   Pulse 85   Temp 97.9 F (36.6 C) (Oral)   Ht 5' 8.5" (1.74 m)   Wt (!) 307 lb (139.3 kg)   SpO2 95%   BMI 46.00 kg/m  Wt Readings from Last 3 Encounters:  03/24/18 (!) 307 lb (139.3 kg)  03/25/17 290 lb (131.5 kg)  11/01/16 288 lb 6.4 oz (130.8 kg)       Assessment & Plan:  1. Pain of right hand - DG Hand Complete Right; Future  2. Right arm pain - DG Forearm Right; Future  3. Injury of right upper extremity, initial encounter - DG Hand Complete Right; Future - DG Forearm Right; Future - Ace bandaged applied and patient agreed to purchase an over the counter sling to support arm prn  Patient Instructions  Good to see you today  I will notify you of xray results, in the meantime, keep arm elevated  as possible, wear sling during the day, remove at night, Do gentle range of motion with elbow and wrist.   I will notify you of your xray results and any additional follow up that is needed    Clarene Reamer, FNP-BC  [facility], [place]  03/24/2018 5:07 PM

## 2018-03-27 ENCOUNTER — Telehealth: Payer: Self-pay | Admitting: Family Medicine

## 2018-03-27 NOTE — Telephone Encounter (Signed)
Just received xray report this am. See result note.

## 2018-03-27 NOTE — Telephone Encounter (Signed)
Pt called to get update from x-rays. She is anxious that she has a broken hand and doesn't want it to start to heal and have to rebreak in this wait time.

## 2018-08-23 ENCOUNTER — Other Ambulatory Visit: Payer: Self-pay

## 2018-08-23 ENCOUNTER — Ambulatory Visit: Payer: Self-pay | Admitting: Family Medicine

## 2018-08-23 ENCOUNTER — Encounter: Payer: Self-pay | Admitting: Family Medicine

## 2018-08-23 VITALS — BP 112/82 | HR 80 | Temp 98.7°F | Ht 68.5 in | Wt 298.2 lb

## 2018-08-23 DIAGNOSIS — M7052 Other bursitis of knee, left knee: Secondary | ICD-10-CM

## 2018-08-23 DIAGNOSIS — M25562 Pain in left knee: Secondary | ICD-10-CM

## 2018-08-23 MED ORDER — METHYLPREDNISOLONE ACETATE 40 MG/ML IJ SUSP
80.0000 mg | Freq: Once | INTRAMUSCULAR | Status: AC
  Administered 2018-08-23: 80 mg via INTRA_ARTICULAR

## 2018-08-23 NOTE — Progress Notes (Signed)
Summer Ridgeway T. Tyara Dassow, MD [facility] and [place] [facility] [place], [address] Phone: [phone]  FAX: [place] - 63 y.o. female  MRN [id number]  Date of Birth: [birth year]  Visit Date: 08/23/2018  PCP: Summer Loffler, MD  Referred by: Summer Loffler, MD  Chief Complaint  Patient presents with  . Knee Pain    Left   Subjective:   Summer Brown is a 63 y.o. very pleasant female patient with Body mass index is 44.69 kg/m. who presents with the following:  She is having some pain and swelling in the joint, and she is also having some pain around the top of the tibia.  She is not having any known specific injury.  She is up and down a lot and moving in her household.  She does work at [employer] at baseline.  She has not had any specific injury, trauma or prior surgery to this affected knee.  She is not having any mechanical symptoms.  L knee pain:   Pain and swelling around the knee cap.   Up and down a lot.  Dogs are out  Back to work - works at [employer].    Knee inj and pes inj L knee  SHE STOPPED INTRAARTICULAR INJECTION DUE TO PAIN  Past Medical History, Surgical History, Social History, Family History, Problem List, Medications, and Allergies have been reviewed and updated if relevant.  Patient Active Problem List   Diagnosis Date Noted  . Acute pain of left knee 03/25/2017  . Controlled type 2 diabetes mellitus without complication, without long-term current use of insulin ([place]) 11/06/2016  . Morbid obesity ([place]) 08/07/2014  . Chronic venous insufficiency 08/07/2014  . Chronic back pain 12/12/2013    Past Medical History:  Diagnosis Date  . Chronic back pain 12/12/2013  . Controlled type 2 diabetes mellitus without complication, without long-term current use of insulin ([place]) 11/06/2016  . Ulcer     Past Surgical History:  Procedure Laterality Date  . BACK SURGERY    . FRACTURE  SURGERY    . NOSE SURGERY    . OVARIAN CYST SURGERY    . SPINE SURGERY      Social History   Socioeconomic History  . Marital status: Single    Spouse name: Not on file  . Number of children: Not on file  . Years of education: Not on file  . Highest education level: Not on file  Occupational History    Employer: OTHER    Comment: [employer]  Social Needs  . Financial resource strain: Not on file  . Food insecurity    Worry: Not on file    Inability: Not on file  . Transportation needs    Medical: Not on file    Non-medical: Not on file  Tobacco Use  . Smoking status: Never Smoker  . Smokeless tobacco: Never Used  Substance and Sexual Activity  . Alcohol use: Yes    Comment: rare  . Drug use: No  . Sexual activity: Not on file  Lifestyle  . Physical activity    Days per week: Not on file    Minutes per session: Not on file  . Stress: Not on file  Relationships  . Social Herbalist on phone: Not on file    Gets together: Not on file    Attends religious service: Not on file    Active  member of club or organization: Not on file    Attends meetings of clubs or organizations: Not on file    Relationship status: Not on file  . Intimate partner violence    Fear of current or ex partner: Not on file    Emotionally abused: Not on file    Physically abused: Not on file    Forced sexual activity: Not on file  Other Topics Concern  . 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 Diabetes Father     Allergies  Allergen Reactions  . Codeine Itching    Medication list reviewed and updated in full in [place].  GEN: No fevers, chills. Nontoxic. Primarily MSK c/o today. MSK: Detailed in the HPI GI: tolerating PO intake without difficulty Neuro: No numbness, parasthesias, or tingling associated. Otherwise the pertinent positives of the ROS are noted above.    Objective:   BP 112/82   Pulse 80   Temp 98.7 F (37.1 C) (Temporal)   Ht 5' 8.5" (1.74 m)   Wt 298 lb 4 oz (135.3 kg)   SpO2 97%   BMI 44.69 kg/m    GEN: WDWN, NAD, Non-toxic, Alert & Oriented x 3 HEENT: Atraumatic, Normocephalic.  Ears and Nose: No external deformity. EXTR: No clubbing/cyanosis/edema NEURO: Normal gait.  PSYCH: Normally interactive. Conversant. Not depressed or anxious appearing.  Calm demeanor.    Left knee: Full extension, flexion to 110 degrees.  Minimal effusion.  No pain with patellar manipulation.  Notable medial joint line tenderness, and to much lesser extent lateral joint line tenderness.  Stable to varus and valgus stress.  ACL PCL are intact.  She also has tenderness at the pes bursa.  Radiology: No results found.  Assessment and Plan:     ICD-10-CM   1. Pes anserinus bursitis of left knee  M70.52   2. Acute pain of left knee  M25.562 methylPREDNISolone acetate (DEPO-MEDROL) injection 80 mg   Basic Tylenol, NSAIDs, and ice.  Also wanted to do an intra-articular knee injection, but the patient was unable to continue after her pes bursa injection secondary to pain.  Aspiration/Injection Procedure Note Summer Brown April 29, 1955 Date of procedure: 08/23/2018  Procedure: Large Joint Aspiration / Injection of Knee, Pes Bursa, L Indications: Pain  Procedure Details Verbal consent was obtained. Risks (including rare infection, skin lightening, and potential atrophy), benefits, and alternatives explained. Sterilely prepped with Chloraprep. Ethyl chloride for anesthesia. Under sterile conditions, 2 cc of Lidocaine 1% and 1 cc of Depo-Medrol 40 mg injected directly on the pes anserinus perpendicularly taking the needle to bone then slightly withdrawing. No resistance encountered. No complications with procedure and tolerated well. Patient had decreased pain post-injection. 22 gauge 1 1/2 inch needle Medication: Depo-Medrol 40 mg   Follow-up: No  follow-ups on file.  Meds ordered this encounter  Medications  . methylPREDNISolone acetate (DEPO-MEDROL) injection 80 mg   No orders of the defined types were placed in this encounter.   Signed,  Summer Deed. Amandajo Gonder, MD   Outpatient Encounter Medications as of 08/23/2018  Medication Sig  . [EXPIRED] methylPREDNISolone acetate (DEPO-MEDROL) injection 80 mg    No facility-administered encounter medications on file as of 08/23/2018.

## 2020-02-17 ENCOUNTER — Ambulatory Visit
Admission: EM | Admit: 2020-02-17 | Discharge: 2020-02-17 | Disposition: A | Discharge status: Home / Self Care | Payer: Medicare Other

## 2020-02-17 ENCOUNTER — Other Ambulatory Visit: Payer: Self-pay

## 2020-02-17 ENCOUNTER — Encounter: Payer: Self-pay | Admitting: Emergency Medicine

## 2020-02-17 ENCOUNTER — Ambulatory Visit (INDEPENDENT_AMBULATORY_CARE_PROVIDER_SITE_OTHER): Payer: Medicare Other

## 2020-02-17 DIAGNOSIS — W19XXXA Unspecified fall, initial encounter: Secondary | ICD-10-CM | POA: Diagnosis not present

## 2020-02-17 DIAGNOSIS — M25551 Pain in right hip: Secondary | ICD-10-CM | POA: Diagnosis not present

## 2020-02-17 MED ORDER — TIZANIDINE HCL 2 MG PO TABS: 2.0000 mg | ORAL_TABLET | Freq: Four times a day (QID) | ORAL | 0 refills | Status: AC | PRN

## 2020-02-17 MED ORDER — KETOROLAC TROMETHAMINE 30 MG/ML IJ SOLN
30.0000 mg | Freq: Once | INTRAMUSCULAR | Status: AC
  Administered 2020-02-17: 30 mg via INTRAMUSCULAR

## 2020-02-17 MED ORDER — NAPROXEN 500 MG PO TABS: 500.0000 mg | ORAL_TABLET | Freq: Two times a day (BID) | ORAL | 0 refills | Status: DC

## 2020-02-17 NOTE — ED Provider Notes (Incomplete)
[facility]    CSN: [id number] Arrival date & time: 02/17/20  1426      History   Chief Complaint Chief Complaint  Patient presents with  . Fall  . Hip Pain    HPI Summer Brown is a 65 y.o. female.   HPI  Past Medical History:  Diagnosis Date  . Chronic back pain 12/12/2013  . Controlled type 2 diabetes mellitus without complication, without long-term current use of insulin ([place]) 11/06/2016  . Ulcer     Patient Active Problem List   Diagnosis Date Noted  . Acute pain of left knee 03/25/2017  . Controlled type 2 diabetes mellitus without complication, without long-term current use of insulin ([place]) 11/06/2016  . Morbid obesity ([place]) 08/07/2014  . Chronic venous insufficiency 08/07/2014  . Chronic back pain 12/12/2013    Past Surgical History:  Procedure Laterality Date  . BACK SURGERY    . CERVICAL SPINE SURGERY    . FRACTURE SURGERY    . NOSE SURGERY    . OVARIAN CYST SURGERY    . SPINE SURGERY      OB History   No obstetric history on file.      Home Medications    Prior to Admission medications   Medication Sig Start Date End Date Taking? Authorizing Provider  Aspirin-Acetaminophen-Caffeine (EXCEDRIN PO) Take by mouth.   Yes [provider]  IBUPROFEN PO Take by mouth.   Yes [provider]    Family History Family History  Problem Relation Age of Onset  . Diabetes Father   . Heart attack Mother     Social History Social History   Tobacco Use  . Smoking status: Never Smoker  . Smokeless tobacco: Never Used  Vaping Use  . Vaping Use: Never used  Substance Use Topics  . Alcohol use: Not Currently  . Drug use: No     Allergies   Codeine   Review of Systems Review of Systems   Physical Exam Triage Vital Signs ED Triage Vitals [02/17/20 1441]  Enc Vitals Group     BP (!) 142/85     Pulse Rate 91     Resp 18     Temp 98.7 F (37.1 C)     Temp Source Temporal     SpO2 96 %     Weight       Height      Head Circumference      Peak Flow      Pain Score 10     Pain Loc      Pain Edu?      Excl. in [place]?    No data found.  Updated Vital Signs BP (!) 142/85   Pulse 91   Temp 98.7 F (37.1 C) (Temporal)   Resp 18   SpO2 96%   Visual Acuity Right Eye Distance:   Left Eye Distance:   Bilateral Distance:    Right Eye Near:   Left Eye Near:    Bilateral Near:     Physical Exam   UC Treatments / Results  Labs (all labs ordered are listed, but only abnormal results are displayed) Labs Reviewed - No data to display  EKG   Radiology No results found.  Procedures Procedures (including critical care time)  Medications Ordered in UC Medications - No data to display  Initial Impression / Assessment and Plan / UC Course  I have reviewed the triage vital signs and the nursing notes.  Pertinent  labs & imaging results that were available during my care of the patient were reviewed by me and considered in my medical decision making (see chart for details).     *** Final Clinical Impressions(s) / UC Diagnoses   Final diagnoses:  None   Discharge Instructions   None    ED Prescriptions    None     PDMP not reviewed this encounter.

## 2020-02-17 NOTE — Discharge Instructions (Addendum)
RICE: rest, ice, compression, elevation as needed for pain.    Pain medication:  500 mg Naprosyn/Aleve (naproxen) every 12 hours with food:  AVOID other NSAIDs while taking this (may have Tylenol).  May take muscle relaxer as needed for severe pain / spasm.  (This medication may cause you to become tired so it is important you do not drink alcohol or operate heavy machinery while on this medication.  Recommend your first dose to be taken before bedtime to monitor for side effects safely)  Important to follow up with specialist(s) below for further evaluation/management if your symptoms persist or worsen. 

## 2020-02-17 NOTE — ED Triage Notes (Signed)
Pt reports losing balance while around her puppies 2 days ago, causing fall.  Denies head injury or LOC.  Denies using blood thinners. Since incident, c/o significant right hip/buttock pain radiating down into RLE.  Denies parasthesias.

## 2020-02-19 ENCOUNTER — Emergency Department
Admission: EM | Admit: 2020-02-19 | Discharge: 2020-02-19 | Disposition: A | Discharge status: Home / Self Care | Payer: Medicare Other | Attending: Emergency Medicine | Admitting: Emergency Medicine

## 2020-02-19 ENCOUNTER — Other Ambulatory Visit: Payer: Self-pay

## 2020-02-19 ENCOUNTER — Emergency Department: Payer: Medicare Other

## 2020-02-19 ENCOUNTER — Telehealth: Payer: Self-pay

## 2020-02-19 DIAGNOSIS — Z7982 Long term (current) use of aspirin: Secondary | ICD-10-CM | POA: Insufficient documentation

## 2020-02-19 DIAGNOSIS — E119 Type 2 diabetes mellitus without complications: Secondary | ICD-10-CM | POA: Insufficient documentation

## 2020-02-19 DIAGNOSIS — M79651 Pain in right thigh: Secondary | ICD-10-CM | POA: Diagnosis not present

## 2020-02-19 DIAGNOSIS — M542 Cervicalgia: Secondary | ICD-10-CM | POA: Diagnosis not present

## 2020-02-19 DIAGNOSIS — W19XXXA Unspecified fall, initial encounter: Secondary | ICD-10-CM | POA: Insufficient documentation

## 2020-02-19 DIAGNOSIS — M545 Low back pain, unspecified: Secondary | ICD-10-CM | POA: Insufficient documentation

## 2020-02-19 DIAGNOSIS — Y92190 Kitchen in other specified residential institution as the place of occurrence of the external cause: Secondary | ICD-10-CM | POA: Insufficient documentation

## 2020-02-19 MED ORDER — PREDNISONE 10 MG (21) PO TBPK: ORAL_TABLET | ORAL | 0 refills | Status: DC

## 2020-02-19 MED ORDER — METHOCARBAMOL 500 MG PO TABS: 500.0000 mg | ORAL_TABLET | Freq: Three times a day (TID) | ORAL | 0 refills | Status: AC | PRN

## 2020-02-19 MED ORDER — KETOROLAC TROMETHAMINE 30 MG/ML IJ SOLN
30.0000 mg | Freq: Once | INTRAMUSCULAR | Status: AC
  Administered 2020-02-19: 30 mg via INTRAMUSCULAR
  Filled 2020-02-19: qty 1

## 2020-02-19 NOTE — ED Provider Notes (Signed)
[facility]-EMERGENCY DEPARTMENT  ____________________________________________  Time seen: Approximately 9:13 PM  I have reviewed the triage vital signs and the nursing notes.   HISTORY  Chief Complaint Fall   Historian Patient     HPI Summer Brown is a 65 y.o. female presents to the emergency department with low back pain that seems to radiate along the proximal aspect of her thigh.  Patient states that she has been taking tizanidine and tolerates medication well.  She states that she has not been taking naproxen as she feels like it makes her dizzy.  She is requesting a refill of her muscle relaxer.  She has not followed up with orthopedics yet as directed.  No bowel or bladder incontinence or saddle anesthesia.  No other alleviating measures have been attempted.   Past Medical History:  Diagnosis Date  . Chronic back pain 12/12/2013  . Controlled type 2 diabetes mellitus without complication, without long-term current use of insulin ([place]) 11/06/2016  . Ulcer      Immunizations up to date:  Yes.     Past Medical History:  Diagnosis Date  . Chronic back pain 12/12/2013  . Controlled type 2 diabetes mellitus without complication, without long-term current use of insulin ([place]) 11/06/2016  . Ulcer     Patient Active Problem List   Diagnosis Date Noted  . Acute pain of left knee 03/25/2017  . Controlled type 2 diabetes mellitus without complication, without long-term current use of insulin ([place]) 11/06/2016  . Morbid obesity ([place]) 08/07/2014  . Chronic venous insufficiency 08/07/2014  . Chronic back pain 12/12/2013    Past Surgical History:  Procedure Laterality Date  . BACK SURGERY    . CERVICAL SPINE SURGERY    . FRACTURE SURGERY    . NOSE SURGERY    . OVARIAN CYST SURGERY    . SPINE SURGERY      Prior to Admission medications   Medication Sig Start Date End Date Taking? Authorizing Provider  methocarbamol (ROBAXIN) 500 MG tablet Take 1 tablet (500 mg total)  by mouth every 8 (eight) hours as needed for up to 5 days. 02/19/20 02/24/20 Yes Vallarie Mare M, PA-C  predniSONE (STERAPRED UNI-PAK 21 TAB) 10 MG (21) TBPK tablet Take 6 tablets the first day, take 5 tablets the second day, take 4 tablets the third day, take 3 tablets the fourth day, take 2 tablets the fifth day, take 1 tablet the sixth day. 02/19/20  Yes Vallarie Mare M, PA-C  Aspirin-Acetaminophen-Caffeine (EXCEDRIN PO) Take by mouth.    [provider]  IBUPROFEN PO Take by mouth.    [provider]  naproxen (NAPROSYN) 500 MG tablet Take 1 tablet (500 mg total) by mouth 2 (two) times daily. 02/17/20   Hall-Potvin, [place], PA-C  tiZANidine (ZANAFLEX) 2 MG tablet Take 1 tablet (2 mg total) by mouth every 6 (six) hours as needed for up to 5 days for muscle spasms. 02/17/20 02/22/20  Hall-Potvin, [place], PA-C    Allergies Codeine  Family History  Problem Relation Age of Onset  . Diabetes Father   . Heart attack Mother     Social History Social History   Tobacco Use  . Smoking status: Never Smoker  . Smokeless tobacco: Never Used  Vaping Use  . Vaping Use: Never used  Substance Use Topics  . Alcohol use: Not Currently  . Drug use: No     Review of Systems  Constitutional: No fever/chills Eyes:  No discharge ENT: No upper respiratory complaints.  Respiratory: no cough. No SOB/ use of accessory muscles to breath Gastrointestinal:   No nausea, no vomiting.  No diarrhea.  No constipation. Musculoskeletal: Patient has right thigh pain.  Skin: Negative for rash, abrasions, lacerations, ecchymosis.    ____________________________________________   PHYSICAL EXAM:  VITAL SIGNS: ED Triage Vitals  Enc Vitals Group     BP 02/19/20 1752 (!) 167/127     Pulse Rate 02/19/20 1752 95     Resp 02/19/20 1752 18     Temp 02/19/20 1752 98.7 F (37.1 C)     Temp Source 02/19/20 1752 Oral     SpO2 02/19/20 1752 98 %     Weight 02/19/20 1753 285 lb (129.3 kg)     Height  02/19/20 1753 5\' 8"  (1.727 m)     Head Circumference --      Peak Flow --      Pain Score 02/19/20 1753 10     Pain Loc --      Pain Edu? --      Excl. in [place]? --      Constitutional: Alert and oriented. Well appearing and in no acute distress. Eyes: Conjunctivae are normal. PERRL. EOMI. Head: Atraumatic. Cardiovascular: Normal rate, regular rhythm. Normal S1 and S2.  Good peripheral circulation. Respiratory: Normal respiratory effort without tachypnea or retractions. Lungs CTAB. Good air entry to the bases with no decreased or absent breath sounds Gastrointestinal: Bowel sounds x 4 quadrants. Soft and nontender to palpation. No guarding or rigidity. No distention. Musculoskeletal: Full range of motion to all extremities. No obvious deformities noted Neurologic:  Normal for age. No gross focal neurologic deficits are appreciated.  Skin:  Skin is warm, dry and intact. No rash noted. Psychiatric: Mood and affect are normal for age. Speech and behavior are normal.   ____________________________________________   LABS (all labs ordered are listed, but only abnormal results are displayed)  Labs Reviewed - No data to display ____________________________________________  EKG   ____________________________________________  RADIOLOGY Unk Pinto, personally viewed and evaluated these images (plain radiographs) as part of my medical decision making, as well as reviewing the written report by the radiologist.    DG Cervical Spine 2-3 Views  Result Date: 02/19/2020 CLINICAL DATA:  Fall in the kitchen a week and a half ago. Patient reports right leg pain and back spasms. EXAM: CERVICAL SPINE - 2-3 VIEW COMPARISON:  None. FINDINGS: Straightening of normal lordosis. Anterior fusion hardware at [id number] without complication. Probable prior non-instrumented fusion at C6 and C7. No evidence of acute fracture. Lateral masses of C1 well aligned on C2. Disc space narrowing and endplate spurring  at [id number]. Solid fusion at C4-C5 and C6-C7 disc space. No prevertebral soft tissue edema. IMPRESSION: 1. No evidence of acute fracture or subluxation of the cervical spine. 2. Postsurgical and degenerative change. No hardware complication. Electronically Signed   By: Keith Rake M.D.   On: 02/19/2020 21:29   DG Lumbar Spine 2-3 Views  Result Date: 02/19/2020 CLINICAL DATA:  Fall in the kitchen a week and half ago. Back spasms on right leg pain. EXAM: LUMBAR SPINE - 2-3 VIEW COMPARISON:  Lumbar radiograph 12/12/2013 FINDINGS: Similar slight dextroscoliotic curvature of the upper lumbar spine. No traumatic subluxation. Posterior rod and intrapedicular screw fusion L5-S1 with interbody spacer. No acute fracture. Diffuse disc space narrowing and endplate spurring throughout the lumbar spine. Multilevel facet hypertrophy. No posterior element fracture. Sacroiliac joints are congruent. IMPRESSION: 1. No acute fracture or traumatic subluxation of  the lumbar spine. 2. Postsurgical and multilevel degenerative change. Electronically Signed   By: Keith Rake M.D.   On: 02/19/2020 21:28    ____________________________________________    PROCEDURES  Procedure(s) performed:     Procedures     Medications  ketorolac (TORADOL) 30 MG/ML injection 30 mg (30 mg Intramuscular Given 02/19/20 2158)     ____________________________________________   INITIAL IMPRESSION / ASSESSMENT AND PLAN / ED COURSE  Pertinent labs & imaging results that were available during my care of the patient were reviewed by me and considered in my medical decision making (see chart for details).      Assessment and Plan:  Back pain Neck pain 65 year old female presents to the emergency department with neck pain and low back pain that radiates to the right thigh.  No bony abnormalities were visualized on x-rays of the cervical spine or lumbar spine.  We will treat with tapered prednisone and Robaxin and advised  patient to follow-up with orthopedics.  Toradol was given in the emergency department prior to discharge.  All patient questions were answered.    ____________________________________________  FINAL CLINICAL IMPRESSION(S) / ED DIAGNOSES  Final diagnoses:  Fall, initial encounter      NEW MEDICATIONS STARTED DURING THIS VISIT:  ED Discharge Orders         Ordered    methocarbamol (ROBAXIN) 500 MG tablet  Every 8 hours PRN        02/19/20 2214    predniSONE (STERAPRED UNI-PAK 21 TAB) 10 MG (21) TBPK tablet        02/19/20 2214              This chart was dictated using voice recognition software/Dragon. Despite best efforts to proofread, errors can occur which can change the meaning. Any change was purely unintentional.     Lannie Fields, PA-C 02/19/20 2235    Harvest Dark, MD 02/19/20 2350

## 2020-02-19 NOTE — ED Triage Notes (Signed)
Pt states she fell in kitchen a week and a half ago. C/o R leg pain. States having spasms in leg/hip/back. A&O, in wheelchair.

## 2020-02-19 NOTE — Telephone Encounter (Signed)
If she thinks that she can wait a week, then I can.  If she feels that she needs to be seen sooner, then I would see someone else.

## 2020-02-19 NOTE — Telephone Encounter (Signed)
[place] [facility] - Client TELEPHONE ADVICE RECORD [facility] Patient Name: Summer Brown Gender: Female DOB: January 07, 1956 Age: 65 Y 50 M 25 D Return Phone Number: [phone] (Primary) Address: City/State/Zip: [place] [address] Client Summer Brown Day - Client Client Site [place] - Day Physician Copland, Frederico Hamman - MD Contact Type Call Who Is Calling Patient / Member / Family / Caregiver Call Type Triage / Clinical Relationship To Patient Self Return Phone Number [phone] (Primary) Chief Complaint Leg Pain Reason for Call Symptomatic / Request for Summer Brown states she fell and is still having leg pain. [place] Not Listed UC Translation No Nurse Assessment Nurse: Rodney Cruise, RN, Hilliard Clark Date/Time (Eastern Time): 02/19/2020 1:26:04 PM Confirm and document reason for call. If symptomatic, describe symptoms. ---Caller states fell about a week ago, was seen at the UC and states no broken hip. Was prescribed pain medication and muscle relaxer, is difficult to stand for work. Does the patient have any new or worsening symptoms? ---Yes Will a triage be completed? ---Yes Related visit to physician within the last 2 weeks? ---Yes Does the PT have any chronic conditions? (i.e. diabetes, asthma, this includes High risk factors for pregnancy, etc.) ---Unknown Is this a behavioral health or substance abuse call? ---No Guidelines Guideline Title Affirmed Question Affirmed Notes Nurse Date/Time (Eastern Time) Falls and Falling [1] MODERATE weakness (i.e., interferes with work, school, normal activities) AND [2] newonset or worsening Baxter, Therapist, sports, Hilliard Clark 02/19/2020 1:29:12 PM Disp. Time Eilene Ghazi Time) Disposition Final User 02/19/2020 1:37:03 PM See HCP within 4 Hours (or PCP triage) Yes Baxter, RN, Lillia Dallas Disagree/Comply Comply PLEASE NOTE: All timestamps contained within this report  are represented as [place] Standard Time. CONFIDENTIALTY NOTICE: This fax transmission is intended only for the addressee. It contains information that is legally privileged, confidential or otherwise protected from use or disclosure. If you are not the intended recipient, you are strictly prohibited from reviewing, disclosing, copying using or disseminating any of this information or taking any action in reliance on or regarding this information. If you have received this fax in error, please notify us immediately by telephone so that we can arrange for its return to [place]. Phone: [phone], Toll-Free: [phone], Fax: [phone] Page: 2 of 2 Call Id: [id number] [place] Understands Yes PreDisposition Did not know what to do Care Advice Given Per Guideline SEE HCP (OR PCP TRIAGE) WITHIN 4 HOURS: CALL BACK IF: * You become worse CARE ADVICE given per Fall and Falling (Adult) guideline. Comments User: Miles Costain, RN Date/Time Eilene Ghazi Time): 02/19/2020 1:36:58 PM Contacted office and informed of triage and recommendation. Office states there are not appointments available and client will need to be seen at [facility]. Informed caller per office, client became irritated and kept repeating she has already been seen at the [facility] and they can't do anything for her at this point, wants a referral to be seen somewhere. Was going to contact office again to discuss and see if they could speak with the client, client states is going to go to the hospital and disconnected the call. Referrals GO TO FACILITY OTHER - SPECIFY

## 2020-02-19 NOTE — ED Notes (Addendum)
Pt transported to her vehicle via wheelchair.

## 2020-02-19 NOTE — Telephone Encounter (Signed)
I held with  at [facility] in [place] for over 20' where pt was referred to go when seen at [facility] at [place] on 02/17/20. Pt does not need referral or appt to be seen at [facility] per recorded message while waiting on phone. Pt was to FU at [facility] [address] [place] [address]. Phone [phone]. I spoke with Butch Penny CMA and she said to give pt above info and if pt wants to see Dr Lorelei Pont offer appt with first available appt on 02/25/20. Left v/m requesting pt to call [place].

## 2020-02-19 NOTE — Discharge Instructions (Signed)
Take Robaxin and prednisone as directed.

## 2020-02-19 NOTE — ED Notes (Signed)
See triage note. Pt sitting on edge of stretcher with feet planted on floor. Has shoes on. Reports worst of her pain is in her R thigh and R lower back; spasm per pt; 10/10 per pt. Pt able to move all limbs appropriately currently. Pt reports fell a week ago as R leg gave out on her d/t sudden pain.

## 2020-02-19 NOTE — Telephone Encounter (Addendum)
Left v/m for pt to call [facility]. Sending note to Dr Lorelei Pont and Butch Penny CMA.

## 2020-02-20 NOTE — Telephone Encounter (Signed)
Ms. Spelman was seen in the ER yesterday.

## 2020-08-21 ENCOUNTER — Other Ambulatory Visit (INDEPENDENT_AMBULATORY_CARE_PROVIDER_SITE_OTHER): Payer: Medicare Other

## 2020-08-21 ENCOUNTER — Other Ambulatory Visit: Payer: Self-pay

## 2020-08-21 DIAGNOSIS — Z1159 Encounter for screening for other viral diseases: Secondary | ICD-10-CM

## 2020-08-21 DIAGNOSIS — E785 Hyperlipidemia, unspecified: Secondary | ICD-10-CM

## 2020-08-21 DIAGNOSIS — E119 Type 2 diabetes mellitus without complications: Secondary | ICD-10-CM | POA: Diagnosis not present

## 2020-08-21 DIAGNOSIS — Z79899 Other long term (current) drug therapy: Secondary | ICD-10-CM

## 2020-08-21 LAB — BASIC METABOLIC PANEL
BUN: 12 mg/dL (ref 6–23)
CO2: 30 mEq/L (ref 19–32)
Calcium: 9.3 mg/dL (ref 8.4–10.5)
Chloride: 100 mEq/L (ref 96–112)
Creatinine, Ser: 0.75 mg/dL (ref 0.40–1.20)
GFR: 83.16 mL/min (ref >60.00)
Glucose, Bld: 187 mg/dL — ABNORMAL HIGH (ref 70–99)
Potassium: 4.3 mEq/L (ref 3.5–5.1)
Sodium: 136 mEq/L (ref 135–145)

## 2020-08-21 LAB — CBC WITH DIFFERENTIAL/PLATELET
Basophils Absolute: 0 10*3/uL (ref 0.0–0.1)
Basophils Relative: 0.6 % (ref 0.0–3.0)
Eosinophils Absolute: 0.2 10*3/uL (ref 0.0–0.7)
Eosinophils Relative: 3.4 % (ref 0.0–5.0)
HCT: 44.1 % (ref 36.0–46.0)
Hemoglobin: 14.8 g/dL (ref 12.0–15.0)
Lymphocytes Relative: 43 % (ref 12.0–46.0)
Lymphs Abs: 2.6 10*3/uL (ref 0.7–4.0)
MCHC: 33.5 g/dL (ref 30.0–36.0)
MCV: 91.9 fl (ref 78.0–100.0)
Monocytes Absolute: 0.5 10*3/uL (ref 0.1–1.0)
Monocytes Relative: 7.8 % (ref 3.0–12.0)
Neutro Abs: 2.7 10*3/uL (ref 1.4–7.7)
Neutrophils Relative %: 45.2 % (ref 43.0–77.0)
Platelets: 256 10*3/uL (ref 150.0–400.0)
RBC: 4.8 Mil/uL (ref 3.87–5.11)
RDW: 12.9 % (ref 11.5–15.5)
WBC: 6.1 10*3/uL (ref 4.0–10.5)

## 2020-08-21 LAB — HEPATIC FUNCTION PANEL
ALT: 20 U/L (ref 0–35)
AST: 16 U/L (ref 0–37)
Albumin: 4.1 g/dL (ref 3.5–5.2)
Alkaline Phosphatase: 69 U/L (ref 39–117)
Bilirubin, Direct: 0.1 mg/dL (ref 0.0–0.3)
Total Bilirubin: 0.6 mg/dL (ref 0.2–1.2)
Total Protein: 7.1 g/dL (ref 6.0–8.3)

## 2020-08-21 LAB — LIPID PANEL
Cholesterol: 175 mg/dL (ref 0–200)
HDL: 40.9 mg/dL (ref >39.00)
LDL Cholesterol: 115 mg/dL — ABNORMAL HIGH (ref 0–99)
NonHDL: 134.2
Total CHOL/HDL Ratio: 4
Triglycerides: 96 mg/dL (ref 0.0–149.0)
VLDL: 19.2 mg/dL (ref 0.0–40.0)

## 2020-08-21 LAB — HEMOGLOBIN A1C: Hgb A1c MFr Bld: 10.5 % — ABNORMAL HIGH (ref 4.6–6.5)

## 2020-08-21 NOTE — Addendum Note (Signed)
Addended by: Cloyd Stagers on: 08/21/2020 09:10 AM   Modules accepted: Orders

## 2020-08-22 LAB — HEPATITIS C ANTIBODY
Hepatitis C Ab: NONREACTIVE
SIGNAL TO CUT-OFF: 0.03 (ref <1.00)

## 2020-08-28 ENCOUNTER — Encounter: Payer: Self-pay | Admitting: Family Medicine

## 2020-08-28 ENCOUNTER — Ambulatory Visit (INDEPENDENT_AMBULATORY_CARE_PROVIDER_SITE_OTHER): Payer: Medicare Other | Admitting: Family Medicine

## 2020-08-28 ENCOUNTER — Other Ambulatory Visit: Payer: Self-pay

## 2020-08-28 VITALS — BP 110/78 | HR 117 | Temp 98.4°F | Ht 66.5 in | Wt 288.2 lb

## 2020-08-28 DIAGNOSIS — R221 Localized swelling, mass and lump, neck: Secondary | ICD-10-CM | POA: Diagnosis not present

## 2020-08-28 DIAGNOSIS — Z0000 Encounter for general adult medical examination without abnormal findings: Secondary | ICD-10-CM | POA: Diagnosis not present

## 2020-08-28 DIAGNOSIS — E11618 Type 2 diabetes mellitus with other diabetic arthropathy: Secondary | ICD-10-CM

## 2020-08-28 DIAGNOSIS — E1165 Type 2 diabetes mellitus with hyperglycemia: Secondary | ICD-10-CM | POA: Diagnosis not present

## 2020-08-28 DIAGNOSIS — IMO0002 Reserved for concepts with insufficient information to code with codable children: Secondary | ICD-10-CM

## 2020-08-28 MED ORDER — GLIPIZIDE ER 5 MG PO TB24: ORAL_TABLET | ORAL | 3 refills | Status: DC

## 2020-08-28 MED ORDER — EPINEPHRINE 0.3 MG/0.3ML IJ SOAJ: 0.3000 mg | INTRAMUSCULAR | 3 refills | Status: AC | PRN

## 2020-08-28 NOTE — Progress Notes (Signed)
  Spencer T. Copland, MD, CAQ Sports Medicine [facility] [address] [place] , [address]  Phone: [phone]  FAX: [phone]  Summer Brown - 65 y.o. female  MRN [id number]  Date of Birth: 05/16/1955  Date: 08/28/2020  PCP: Copland, Spencer, MD  Referral: Copland, Spencer, MD  Chief Complaint  Patient presents with   Medicare Wellness    This visit occurred during the SARS-CoV-2 public health emergency.  Safety protocols were in place, including screening questions prior to the visit, additional usage of staff PPE, and extensive cleaning of exam room while observing appropriate contact time as indicated for disinfecting solutions.   Patient Care Team: Copland, Spencer, MD as PCP - General (Family Medicine) Subjective:   Summer Brown is a 65 y.o. pleasant patient who presents for a medicare wellness examination:  Health Maintenance Summary Reviewed and updated, unless pt declines services.  Tobacco History Reviewed. Non-smoker Alcohol: No concerns, no excessive use Exercise Habits: Some activity, rec at least 30 mins 5 times a week STD concerns: none Drug Use: None Birth control method: n/a Menses regular: n/a Lumps or breast concerns: no Breast Cancer Family History: no  Prevnar Shingrix declines  Colonoscopy - unsure  Covid vaccine? declines  Mammo - will think about it  Does not want to go on Metformin.   DM.  Very poorly controlled, off of medication for years, no follow-up in > 2 years Lab Results  Component Value Date   HGBA1C 10.5 (H) 08/21/2020     2.  Prevnar vaccine -  3.  Declines a covid sho 4.  Colon cancer screening.  She declines  5.  Will get a mammogram - possible  6.  She never got chicken pox - declines shingrix  8.  Body mass index is 45.83 kg/m.   ? Throat will close up.  Allergy referral -She has a history of some throat swelling very recently without swelling of the eyes,  eyelids, or other associated regions of the face.  This is always resolved on its own.  She attributes this to oral intake, and other things possibly.  Health Maintenance  Topic Date Due   COVID-19 Vaccine (1) Never done   FOOT EXAM  Never done   OPHTHALMOLOGY EXAM  Never done   URINE MICROALBUMIN  Never done   TETANUS/TDAP  Never done   Zoster Vaccines- Shingrix (1 of 2) Never done   COLONOSCOPY (Pts 45-49yrs Insurance coverage will need to be confirmed)  Never done   MAMMOGRAM  09/15/2016   DEXA SCAN  Never done   PNA vac Low Risk Adult (1 of 2 - PCV13) Never done   INFLUENZA VACCINE  08/18/2020   HEMOGLOBIN A1C  02/21/2021   Hepatitis C Screening  Completed   HPV VACCINES  Aged Out    There is no immunization history on file for this patient.  Patient Active Problem List   Diagnosis Date Noted   Uncontrolled diabetes mellitus with arthropathy, without long-term current use of insulin (HCC) 11/06/2016   Morbid obesity (HCC) 08/07/2014   Chronic venous insufficiency 08/07/2014   Chronic back pain 12/12/2013    Past Medical History:  Diagnosis Date   Chronic back pain 12/12/2013   Controlled type 2 diabetes mellitus without complication, without long-term current use of insulin (HCC) 11/06/2016   Ulcer     Past Surgical History:  Procedure Laterality Date   BACK SURGERY     CERVICAL SPINE SURGERY       FRACTURE SURGERY     NOSE SURGERY     OVARIAN CYST SURGERY     SPINE SURGERY      Family History  Problem Relation Age of Onset   Diabetes Father    Heart attack Mother     Past Medical History, Surgical History, Social History, Family History, Problem List, Medications, and Allergies have been reviewed and updated if relevant.  Review of Systems: Pertinent positives are listed above.  Otherwise, a full 14 point review of systems has been done in full and it is negative except where it is noted positive.  Objective:   BP 110/78   Pulse (!) 117   Temp 98.4 F  (36.9 C) (Temporal)   Ht 5' 6.5" (1.689 m)   Wt 288 lb 4 oz (130.7 kg)   SpO2 96%   BMI 45.83 kg/m  Fall Risk  08/28/2020 09/12/2014  Falls in the past year? 1 No  Number falls in past yr: 0 -  Injury with Fall? 1 -   Ideal Body Weight: Weight in (lb) to have BMI = 25: 156.9 Hearing Screening  Method: Audiometry   500Hz 1000Hz 2000Hz 4000Hz  Right ear _0 Left ear _1 Vision Screening   Right eye Left eye Both eyes  Without correction 20/25 20/20 20/15  With correction      Depression screen [facility] 2/9 08/28/2020 09/12/2014  Decreased Interest 0 0  Down, Depressed, Hopeless 0 0  PHQ - 2 Score 0 0     GEN: well developed, well nourished, no acute distress Eyes: conjunctiva and lids normal, PERRLA, EOMI ENT: TM clear, nares clear, oral exam WNL Neck: supple, no lymphadenopathy, no thyromegaly, no JVD Pulm: clear to auscultation and percussion, respiratory effort normal CV: regular rate and rhythm, S1-S2, no murmur, rub or gallop, no bruits Chest: no scars, masses, no lumps BREAST: breast exam declined GI: soft, non-tender; no hepatosplenomegaly, masses; active bowel sounds all quadrants GU: GU exam declined Lymph: no cervical, axillary or inguinal adenopathy MSK: gait normal, muscle tone and strength WNL, no joint swelling, effusions, discoloration, crepitus  SKIN: clear, good turgor, color WNL, no rashes, lesions, or ulcerations Neuro: normal mental status, normal strength, sensation, and motion Psych: alert; oriented to person, place and time, normally interactive and not anxious or depressed in appearance.   All labs reviewed with patient.  Lipids: Lab Results  Component Value Date   CHOL 175 08/21/2020   Lab Results  Component Value Date   HDL 40.90 08/21/2020   Lab Results  Component Value Date   LDLCALC 115 (H) 08/21/2020   Lab Results  Component Value Date   TRIG 96.0 08/21/2020   Lab Results  Component Value Date   CHOLHDL 4  08/21/2020   CBC: CBC Latest Ref Rng & Units 08/21/2020 11/01/2016 12/08/2013  WBC 4.0 - 10.5 K/uL 6.1 6.5 9.0  Hemoglobin 12.0 - 15.0 g/dL 14.8 14.8 15.3  Hematocrit 36.0 - 46.0 % 44.1 43.6 45.6  Platelets 150.0 - 400.0 K/uL 256.0 263.0 206    Basic Metabolic Panel:    Component Value Date/Time   NA 136 08/21/2020 0908   NA 139 12/08/2013 1614   K 4.3 08/21/2020 0908   K 3.7 12/08/2013 1614   CL 100 08/21/2020 0908   CL 105 12/08/2013 1614   CO2 30 08/21/2020 0908   CO2 30 12/08/2013 1614   BUN 12 08/21/2020 0908   BUN 18 12/08/2013 1614  CREATININE 0.75 08/21/2020 0908   CREATININE 0.87 12/08/2013 1614   GLUCOSE 187 (H) 08/21/2020 0908   GLUCOSE 110 (H) 12/08/2013 1614   CALCIUM 9.3 08/21/2020 0908   CALCIUM 8.4 (L) 12/08/2013 1614   Hepatic Function Latest Ref Rng & Units 08/21/2020 11/01/2016 08/07/2014  Total Protein 6.0 - 8.3 g/dL 7.1 7.7 7.0  Albumin 3.5 - 5.2 g/dL 4.1 4.2 3.8  AST 0 - 37 U/L _0 ALT 0 - 35 U/L _1 Alk Phosphatase 39 - 117 U/L 69 66 76  Total Bilirubin 0.2 - 1.2 mg/dL 0.6 0.4 0.4  Bilirubin, Direct 0.0 - 0.3 mg/dL 0.1 - -    Lab Results  Component Value Date   HGBA1C 10.5 (H) 08/21/2020   Lab Results  Component Value Date   TSH 2.43 11/01/2016    No results found.  Assessment and Plan:     ICD-10-CM   1. Healthcare maintenance  Z00.00     2. Uncontrolled diabetes mellitus with arthropathy, without long-term current use of insulin (HCC)  E11.618    E11.65     3. Throat swelling  R22.1 Ambulatory referral to Allergy     She declines all of my recommended health maintenance, and this was reviewed in great detail.  She declines all vaccines.  She declines colonoscopy or colon cancer screening.  She is going to think about mammogram.  The patient declines routine health maintenance services noted. We reviewed that could lead to missing significant problems that could affect there mortality. The patient indicated that they  understood this and was willing to accept those risks.   Diabetes is very poorly controlled.  Hemoglobin A1c is greater than 10.  She declines my recommendation for revisiting metformin.  She did take 1 tablet multiple years ago.  I am going to start her on some glipizide and try to titrate this up.  I am doubtful that this 1 agent will get her blood sugars under control.  Follow-up 3 months.  She gives a history of possible anaphylaxis.  EpiPen.  Consultation with allergist.  Health Maintenance Exam: The patient's preventative maintenance and recommended screening tests for an annual wellness exam were reviewed in full today. Brought up to date unless services declined.  Counselled on the importance of diet, exercise, and its role in overall health and mortality. The patient's FH and SH was reviewed, including their home life, tobacco status, and drug and alcohol status.  Follow-up in 1 year for physical exam or additional follow-up below.  I have personally reviewed the Medicare Annual Wellness questionnaire and have noted 1. The patient's medical and social history 2. Their use of alcohol, tobacco or illicit drugs 3. Their current medications and supplements 4. The patient's functional ability including ADL's, fall risks, home safety risks and hearing or visual             impairment. 5. Diet and physical activities 6. Evidence for depression or mood disorders 7. Reviewed Updated provider list, see scanned forms and [facility] Snapshot.  8. Reviewed whether or not the patient has HCPOA or living will, and discussed what this means with the patient.  Recommended she bring in a copy for his chart in [facility].  The patients weight, height, BMI and visual acuity have been recorded in the chart I have made referrals, counseling and provided education to the patient based review of the above and I have provided the pt with a written personalized care plan for preventive  services.  I have provided the  patient with a copy of your personalized plan for preventive services. Instructed to take the time to review along with their updated medication list.  Follow-up: Return in about 3 months (around 11/28/2020) for diabetes follow-up. Or follow-up in 1 year unless noted.  No future appointments.  Meds ordered this encounter  Medications   EPINEPHrine 0.3 mg/0.3 mL IJ SOAJ injection    Sig: Inject 0.3 mg into the muscle as needed for anaphylaxis.    Dispense:  1 each    Refill:  3   glipiZIDE (GLUCOTROL XL) 5 MG 24 hr tablet    Sig: Take one tablet for 2 weeks, then increase to 2 tablets a day thereafter    Dispense:  60 tablet    Refill:  3   Medications Discontinued During This Encounter  Medication Reason   predniSONE (STERAPRED UNI-PAK 21 TAB) 10 MG (21) TBPK tablet Completed Course   Orders Placed This Encounter  Procedures   Ambulatory referral to Allergy    Signed,  Spencer T. Copland, MD   Allergies as of 08/28/2020       Reactions   Codeine Itching        Medication List        Accurate as of August 28, 2020 11:59 PM. If you have any questions, ask your nurse or doctor.          STOP taking these medications    predniSONE 10 MG (21) Tbpk tablet Commonly known as: STERAPRED UNI-PAK 21 TAB Stopped by: Spencer Copland, MD       TAKE these medications    EPINEPHrine 0.3 mg/0.3 mL Soaj injection Commonly known as: EPI-PEN Inject 0.3 mg into the muscle as needed for anaphylaxis. Started by: Spencer Copland, MD   EXCEDRIN PO Take by mouth.   glipiZIDE 5 MG 24 hr tablet Commonly known as: GLUCOTROL XL Take one tablet for 2 weeks, then increase to 2 tablets a day thereafter Started by: Spencer Copland, MD   IBUPROFEN PO Take by mouth.   naproxen 500 MG tablet Commonly known as: NAPROSYN Take 1 tablet (500 mg total) by mouth 2 (two) times daily.        

## 2020-09-11 ENCOUNTER — Other Ambulatory Visit: Payer: Self-pay | Admitting: Family Medicine

## 2020-09-11 DIAGNOSIS — Z1231 Encounter for screening mammogram for malignant neoplasm of breast: Secondary | ICD-10-CM

## 2020-10-09 ENCOUNTER — Other Ambulatory Visit: Payer: Self-pay

## 2020-10-09 ENCOUNTER — Ambulatory Visit
Admission: RE | Admit: 2020-10-09 | Discharge: 2020-10-09 | Disposition: A | Discharge status: Home / Self Care | Payer: Medicare Other | Source: Ambulatory Visit | Attending: Family Medicine | Admitting: Family Medicine

## 2020-10-09 DIAGNOSIS — Z1231 Encounter for screening mammogram for malignant neoplasm of breast: Secondary | ICD-10-CM

## 2020-10-17 ENCOUNTER — Encounter: Payer: Self-pay | Admitting: Orthopaedic Surgery

## 2020-10-17 ENCOUNTER — Ambulatory Visit (INDEPENDENT_AMBULATORY_CARE_PROVIDER_SITE_OTHER): Payer: Medicare Other | Admitting: Orthopaedic Surgery

## 2020-10-17 ENCOUNTER — Other Ambulatory Visit: Payer: Self-pay | Admitting: Family Medicine

## 2020-10-17 ENCOUNTER — Ambulatory Visit: Payer: Self-pay

## 2020-10-17 ENCOUNTER — Other Ambulatory Visit: Payer: Self-pay

## 2020-10-17 ENCOUNTER — Telehealth: Payer: Self-pay

## 2020-10-17 VITALS — Ht 66.5 in | Wt 294.0 lb

## 2020-10-17 DIAGNOSIS — M1712 Unilateral primary osteoarthritis, left knee: Secondary | ICD-10-CM

## 2020-10-17 DIAGNOSIS — R928 Other abnormal and inconclusive findings on diagnostic imaging of breast: Secondary | ICD-10-CM

## 2020-10-17 DIAGNOSIS — Z6841 Body Mass Index (BMI) 40.0 and over, adult: Secondary | ICD-10-CM | POA: Diagnosis not present

## 2020-10-17 NOTE — Progress Notes (Signed)
Office Visit Note   Patient: Summer Brown           Date of Birth: July 20, 1955           MRN: [id number] Visit Date: 10/17/2020              Requested by: Owens Loffler, MD [place],  [place] [address] PCP: Owens Loffler, MD   Assessment & Plan: Visit Diagnoses:  1. Primary osteoarthritis of left knee     Plan: Impression is left knee degenerative joint disease.  Today, we had a discussion on weight loss, repeat cortisone injection versus viscosupplementation injection.  We have made a referral to weight loss clinic.  We have also provided her with Visco handout and will go ahead and get approval for viscosupplementation injection.  She is currently on a BMI of almost 47 and will need to get to a weight of 245 pounds in order to get to a BMI of under 40 to proceed with total knee arthroplasty which I believe she will need at some point in the future.  She understands and agrees.  The patient meets the AMA guidelines for Morbid (severe) obesity with a BMI > 40.0 and I have recommended weight loss.  This patient is diagnosed with osteoarthritis of the knee(s).    Radiographs show evidence of joint space narrowing, osteophytes, subchondral sclerosis and/or subchondral cysts.  This patient has knee pain which interferes with functional and activities of daily living.    This patient has experienced inadequate response, adverse effects and/or intolerance with conservative treatments such as acetaminophen, NSAIDS, topical creams, physical therapy or regular exercise, knee bracing and/or weight loss.   This patient has experienced inadequate response or has a contraindication to intra articular steroid injections for at least 3 months.   This patient is not scheduled to have a total knee replacement within 6 months of starting treatment with viscosupplementation.   Follow-Up Instructions: Return for f/u once approved for left knee visco inj.   Orders:  Orders  Placed This Encounter  Procedures   XR KNEE 3 VIEW LEFT   Amb Ref to Medical Weight Management   No orders of the defined types were placed in this encounter.     Procedures: No procedures performed   Clinical Data: No additional findings.   Subjective: Chief Complaint  Patient presents with   Left Knee - Pain    HPI patient is a pleasant 65 year old female who comes in today with left knee pain.  She notes a remote patellar dislocation several years ago.  She was doing okay until about a year ago when she slipped and fell reinjuring the left knee.  She has had pain to the anteromedial aspect since.  She has occasional swelling, locking, catching and popping.  Symptoms are worse with ambulation.  She has been taking ibuprofen without significant relief of symptoms.  She does note that she underwent left knee cortisone injection about 2 years ago which hurt more than anything.  She is not interested in repeating this.  She has not previously undergone viscosupplementation injection.  Review of Systems as detailed in HPI.  All others reviewed and are negative.   Objective: Vital Signs: Ht 5' 6.5" (1.689 m)   Wt 294 lb (133.4 kg)   BMI 46.74 kg/m   Physical Exam well-developed well-nourished female no acute distress.  Alert and oriented x3.  Ortho Exam left knee exam shows trace effusion.  Range of motion 0  to 100 degrees.  Medial joint line tenderness.  Moderate patellofemoral crepitus.  Ligaments are stable.  She is neurovascular intact distally.  Specialty Comments:  No specialty comments available.  Imaging: XR KNEE 3 VIEW LEFT  Result Date: 10/17/2020 Tricompartmental degenerative changes worse in the medial compartment    PMFS History: Patient Active Problem List   Diagnosis Date Noted   Uncontrolled diabetes mellitus with arthropathy, without long-term current use of insulin ([place]) 11/06/2016   Morbid obesity ([place]) 08/07/2014   Chronic venous insufficiency  08/07/2014   Chronic back pain 12/12/2013   Past Medical History:  Diagnosis Date   Chronic back pain 12/12/2013   Controlled type 2 diabetes mellitus without complication, without long-term current use of insulin ([place]) 11/06/2016   Ulcer     Family History  Problem Relation Age of Onset   Diabetes Father    Heart attack Mother     Past Surgical History:  Procedure Laterality Date   BACK SURGERY     CERVICAL SPINE SURGERY     FRACTURE SURGERY     NOSE SURGERY     OVARIAN CYST SURGERY     SPINE SURGERY     Social History   Occupational History    Employer: OTHER    Comment: [employer]  Tobacco Use   Smoking status: Never   Smokeless tobacco: Never  Vaping Use   Vaping Use: Never used  Substance and Sexual Activity   Alcohol use: Not Currently   Drug use: No   Sexual activity: Not on file

## 2020-10-17 NOTE — Telephone Encounter (Signed)
Noted  

## 2020-10-17 NOTE — Telephone Encounter (Signed)
Please precert for left knee visco approval. This is Dr.Xu's patient. Thanks

## 2020-10-30 ENCOUNTER — Encounter: Payer: Self-pay | Admitting: Allergy & Immunology

## 2020-10-30 ENCOUNTER — Ambulatory Visit: Payer: Self-pay | Admitting: Allergy & Immunology

## 2020-10-30 ENCOUNTER — Other Ambulatory Visit: Payer: Self-pay

## 2020-10-30 ENCOUNTER — Ambulatory Visit (INDEPENDENT_AMBULATORY_CARE_PROVIDER_SITE_OTHER): Payer: Medicare Other | Admitting: Allergy & Immunology

## 2020-10-30 VITALS — BP 132/84 | HR 96 | Temp 98.1°F | Resp 18 | Ht 66.0 in | Wt 294.8 lb

## 2020-10-30 DIAGNOSIS — T781XXD Other adverse food reactions, not elsewhere classified, subsequent encounter: Secondary | ICD-10-CM | POA: Diagnosis not present

## 2020-10-30 DIAGNOSIS — R221 Localized swelling, mass and lump, neck: Secondary | ICD-10-CM

## 2020-10-30 NOTE — Progress Notes (Signed)
NEW PATIENT  Date of Service/Encounter:  10/30/20   Assessment:   Intermittent episodes of throat swelling  Negative food testing to the entire panel today  Plan/Recommendations:   1. Throat swelling - Testing was negative to the entire food panel. - There is a the low positive predictive value of food allergy testing and hence the high possibility of false positives. - In contrast, food allergy testing has a high negative predictive value, therefore if testing is negative we can be relatively assured that they are indeed negative.  - We are going to get some lab work to rule out mast cell disease and a red meat allergy called alpha gal. - We will call you in 1-2 weeks with the results of the testing. - EpiPen training provided. - Anaphylaxis management plan provided.  - Continue to note triggers. - We could send you to see EN so that they can look down your throat (I am still considering a diagnosis of vocal cord dysfunction).   2. Return in about 3 months (around 01/30/2021).    Subjective:   Summer Brown is a 65 y.o. female presenting today for follow up of  Chief Complaint  Patient presents with   Food Intolerance    Summer Brown has a history of the following: Patient Active Problem List   Diagnosis Date Noted   Uncontrolled diabetes mellitus with arthropathy, without long-term current use of insulin 11/06/2016   Morbid obesity ([place]) 08/07/2014   Chronic venous insufficiency 08/07/2014   Chronic back pain 12/12/2013    History obtained from: chart review and patient.  Summer Brown is a 65 y.o. female presenting for an evaluation of throat swelling .  This all started a couple of years ago. She has had 7-8 episodes thus far.She report sthat she swims and can hold her breath for 3-4 minutes. She is unsure of the trigger. She thinks that it is related to foods. She reports that she has these symptoms within 30 minutes of eating a meal.  She has five dogs at  home, mostly rescues. She denies allergic rhinitis symptoms. She has never been tested today.  She works for [employer] in [employer]. She has throat closure without much warning. She cannot catch her breath. It lasts for 3-4 minutes and she has never gone to the ED. She just has to keep herself calm and it will "open back up". She has no issues with urticaria, stomach pain, itching of her mouth.  She seems to tolerate all of the major food allergens without adverse event. She tries to avoid sodium like soy sauce and other products. She tells me that every time she eats a hot dog with sodium, she will start scratching and itching.   She does have a history og GERD that she treats with Tums or milk. This is very rare. These are not associated with the throat closure episodes.  Patient Active Problem List   Diagnosis Date Noted   Uncontrolled diabetes mellitus with arthropathy, without long-term current use of insulin 11/06/2016   Morbid obesity ([place]) 08/07/2014   Chronic venous insufficiency 08/07/2014   Chronic back pain 12/12/2013    Past Surgical History:  Procedure Laterality Date   BACK SURGERY     CERVICAL SPINE SURGERY     FRACTURE SURGERY     NOSE SURGERY     OVARIAN CYST SURGERY     SPINE SURGERY      Social History: Patient lives in a house that  was built in 1964.  There is gas heating and central cooling.  There is tiling in the bedrooms.  There are dogs inside of the home.  There are no dust mite covers on the bedding.  There is no tobacco exposure.  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  She does not use a HEPA filter.  She denies any exposure to fumes, chemicals, or dust.    Review of Systems  Constitutional: Negative.  Negative for chills, fever, malaise/fatigue and weight loss.  HENT: Negative.  Negative for congestion, ear discharge, ear pain, sinus pain and sore throat.   Eyes:  Negative for pain, discharge and  redness.  Respiratory:  Negative for cough, sputum production, shortness of breath, wheezing and stridor.   Cardiovascular: Negative.  Negative for chest pain and palpitations.  Gastrointestinal:  Negative for abdominal pain, constipation, diarrhea, heartburn, nausea and vomiting.  Skin: Negative.  Negative for itching and rash.  Neurological:  Negative for dizziness and headaches.  Endo/Heme/Allergies:  Negative for environmental allergies. Does not bruise/bleed easily.      Objective:   Blood pressure 132/84, pulse 96, temperature 98.1 F (36.7 C), resp. rate 18, height 5\' 6"  (1.676 m), weight 294 lb 12.8 oz (133.7 kg), SpO2 96 %. Body mass index is 47.58 kg/m.   Physical Exam:  Physical Exam Vitals reviewed.  Constitutional:      Appearance: She is well-developed.  HENT:     Head: Normocephalic and atraumatic.     Right Ear: Tympanic membrane, ear canal and external ear normal. No drainage, swelling or tenderness. Tympanic membrane is not injected, scarred, erythematous, retracted or bulging.     Left Ear: Tympanic membrane, ear canal and external ear normal. No drainage, swelling or tenderness. Tympanic membrane is not injected, scarred, erythematous, retracted or bulging.     Nose: No nasal deformity, septal deviation, mucosal edema or rhinorrhea.     Right Turbinates: Not enlarged, swollen or pale.     Left Turbinates: Not enlarged, swollen or pale.     Right Sinus: No maxillary sinus tenderness or frontal sinus tenderness.     Left Sinus: No maxillary sinus tenderness or frontal sinus tenderness.     Mouth/Throat:     Mouth: Mucous membranes are not pale and not dry.     Pharynx: Uvula midline.  Eyes:     General:        Right eye: No discharge.        Left eye: No discharge.     Conjunctiva/sclera: Conjunctivae normal.     Right eye: Right conjunctiva is not injected. No chemosis.    Left eye: Left conjunctiva is not injected. No chemosis.    Pupils: Pupils are  equal, round, and reactive to light.  Cardiovascular:     Rate and Rhythm: Normal rate and regular rhythm.     Heart sounds: Normal heart sounds.  Pulmonary:     Effort: Pulmonary effort is normal. No tachypnea, accessory muscle usage or respiratory distress.     Breath sounds: Normal breath sounds. No wheezing, rhonchi or rales.     Comments: Moving air well in all lung fields. No increased work of breathing noted.  Chest:     Chest wall: No tenderness.  Abdominal:     Tenderness: There is no abdominal tenderness. There is no guarding or rebound.  Lymphadenopathy:     Head:     Right side of head:  No submandibular, tonsillar or occipital adenopathy.     Left side of head: No submandibular, tonsillar or occipital adenopathy.     Cervical: No cervical adenopathy.  Skin:    Coloration: Skin is not pale.     Findings: No abrasion, erythema, petechiae or rash. Rash is not papular, urticarial or vesicular.  Neurological:     Mental Status: She is alert.  Psychiatric:        Behavior: Behavior is cooperative.     Diagnostic studies:   Allergy Studies:     Food Adult Perc - 10/30/20 1100      Control-buffer 50% Glycerol Negative    Control-Histamine 1 mg/ml 2+    1. Peanut Negative    2. Soybean Negative    3. Wheat Negative    4. Sesame Negative    5. Milk, cow Negative    6. Egg White, Chicken Negative    7. Casein Negative    8. Shellfish Mix Negative    9. Fish Mix Negative    10. Cashew Negative    11. Pecan Food Negative    12. [place] Negative    13. Almond Negative    14. Hazelnut Negative    15. [place] nut Negative    16. Coconut Negative    17. Pistachio Negative    18. Catfish Negative    19. Bass Negative    20. Trout Negative    21. Tuna Negative    22. Salmon Negative    23. Flounder Negative    24. Codfish Negative    25. Shrimp Negative    26. Crab Negative    27. Lobster Negative    28. Oyster Negative    29. Scallops Negative    30. Barley  Negative    31. Oat  Negative    32. Rye  Negative    33. Hops Negative    34. Rice Negative    35. Cottonseed Negative    36. Saccharomyces Cerevisiae  Negative    37. Pork Negative    38. [place] Meat Negative    39. Chicken Meat Negative    40. Beef Negative    41. Lamb Negative    42. Tomato Negative    43. White Potato Negative    44. Sweet Potato Negative    45. Pea, Green/English Negative    46. Navy Bean Negative    47. Mushrooms Negative    48. Avocado Negative    49. Onion Negative    50. Cabbage Negative    51. Carrots Negative    52. Celery Negative    53. Corn Negative    54. Cucumber Negative    55. Grape (White seedless) Negative    56. Orange  Negative    57. Banana Negative    58. Apple Negative    59. Peach Negative    60. Strawberry Negative    61. Cantaloupe Negative    62. Watermelon Negative    63. Pineapple Negative    64. Chocolate/Cacao bean Negative    65. Karaya Gum Negative    66. Acacia (Arabic Gum) Negative    67. Cinnamon Negative    68. Nutmeg Negative    69. Ginger Negative    70. Garlic Negative    71. Pepper, black Negative    72. Mustard Negative             Allergy testing results were read and interpreted by myself, documented by clinical staff.  Salvatore Marvel, MD  Allergy and [place] [facility] [place]

## 2020-10-30 NOTE — Patient Instructions (Addendum)
1. Throat swelling - Testing was negative to the entire food panel. - There is a the low positive predictive value of food allergy testing and hence the high possibility of false positives. - In contrast, food allergy testing has a high negative predictive value, therefore if testing is negative we can be relatively assured that they are indeed negative.  - We are going to get some lab work to rule out mast cell disease and a red meat allergy called alpha gal. - We will call you in 1-2 weeks with the results of the testing. - EpiPen training provided. - Anaphylaxis management plan provided.  - Continue to note triggers. - We could send you to see ENT so that they can look down your throat (I am still considering a diagnosis of vocal cord dysfunction).   2. Return in about 3 months (around 01/30/2021).    Please inform us of any Emergency Department visits, hospitalizations, or changes in symptoms. Call us before going to the ED for breathing or allergy symptoms since we might be able to fit you in for a sick visit. Feel free to contact us anytime with any questions, problems, or concerns.  It was a pleasure to meet you today!  Websites that have reliable patient information: 1. American Academy of Asthma, Allergy, and Immunology: www.aaaai.org 2. Food Allergy Research and Education (FARE): foodallergy.org 3. Mothers of Asthmatics: http://www.asthmacommunitynetwork.org 4. American College of Allergy, Asthma, and Immunology: www.acaai.org   COVID-19 Vaccine Information can be found at: ShippingScam.co.uk For questions related to vaccine distribution or appointments, please email [email or web address] or call [phone].   We realize that you might be concerned about having an allergic reaction to the COVID19 vaccines. To help with that concern, WE ARE OFFERING THE COVID19 VACCINES IN OUR OFFICE! Ask the front desk for dates!      "Like" [place] on Facebook and Instagram for our latest updates!      A healthy democracy works best when [employer] participate! Make sure you are registered to vote! If you have moved or changed any of your contact information, you will need to get this updated before voting!  In some cases, you MAY be able to register to vote online: [email or web address]

## 2020-11-01 LAB — TRYPTASE: Tryptase: 4.7 ug/L (ref 2.2–13.2)

## 2020-11-06 LAB — ALPHA-GAL PANEL
Allergen Lamb IgE: <0.1 kU/L
Beef IgE: <0.1 kU/L
IgE (Immunoglobulin E), Serum: 50 IU/mL (ref 6–495)
O215-IgE Alpha-Gal: <0.1 kU/L
Pork IgE: <0.1 kU/L

## 2020-11-20 ENCOUNTER — Telehealth: Payer: Self-pay

## 2020-11-20 ENCOUNTER — Telehealth: Payer: Self-pay | Admitting: Family Medicine

## 2020-11-20 DIAGNOSIS — R221 Localized swelling, mass and lump, neck: Secondary | ICD-10-CM

## 2020-11-20 NOTE — Telephone Encounter (Signed)
Thanks for letting us know!   Allergy Crew - can someone call to go over the results from the labs from mid October?   Salvatore Marvel, MD Allergy and [place] [facility] [place]

## 2020-11-20 NOTE — Telephone Encounter (Signed)
Per Dr. Gillermina Hu request I have called patient and informed of her lab results from last month.Patient verbalized understanding and expressed that she'd wait to hear back from Dr. Ernst Bowler and Dr.Copeland.

## 2020-11-20 NOTE — Telephone Encounter (Signed)
Dr. Ernst Bowler,  Summer Brown never got any of the results from all of her recent allergy testing.  I do not think that she uses MyChart.  I am sure that she would appreciate a call.  Thanks! Frederico Hamman

## 2020-11-20 NOTE — Telephone Encounter (Signed)
Patient has been made aware.

## 2020-11-20 NOTE — Telephone Encounter (Signed)
I really want a visualization of her airway, which I think is best accomplished with an ENT evaluation. If she is open to it, I would like to send her to see Dr. Rowe Clack at [facility] for evaluation of possible vocal cord dysfunction.  Can someone call to give her that plan? We can arrange for the referral.    Salvatore Marvel, MD Allergy and [place] [facility]

## 2020-11-20 NOTE — Telephone Encounter (Signed)
Dr. Lorelei Pont sent Dr. Ernst Bowler a message stating that patient was unable to look at her labs via mychart and would appreciate a call regarding them. Dr. Ernst Bowler asked that we give her a call. Patient has been made aware of her lab results. Patient verbalized understanding. Patient is still concerned with that is causing her throat to close. Patient is wondering what the next step would be since her labs were normal. Please advise.

## 2020-11-20 NOTE — Addendum Note (Signed)
Addended by: Valentina Shaggy on: 11/20/2020 06:56 PM   Modules accepted: Orders

## 2020-11-20 NOTE — Telephone Encounter (Signed)
Patient would like to come in tomorrow to get an xray of her breast done before going to get her mammogram because they did seem to notice something  Please contact pt with any questions - [phone]

## 2020-11-20 NOTE — Telephone Encounter (Signed)
Ms. Isaacson needs a diagnostic mammogram and ultrasound of right breast at the [facility].  I called and spoke with patient in regards to this.  I called [facility] and got patient scheduled for 12/18/2020 at 9:30 am.  I cancelled X-ray appointment that was schedule for here tomorrow.   FYI to Dr. Lorelei Pont.  Patient is also asking if Dr. Lorelei Pont had received anything from her allergist appointment.  She states they did a lot of blood test but she has not heard anything.

## 2020-11-21 ENCOUNTER — Other Ambulatory Visit: Payer: Medicare Other

## 2020-11-26 NOTE — Telephone Encounter (Signed)
I left a voicemail to discuss moving forward with the patients referral to ENT.

## 2020-11-28 ENCOUNTER — Telehealth: Payer: Self-pay

## 2020-11-28 NOTE — Telephone Encounter (Signed)
VOB submitted for Orthovisc, left knee. BV pending.

## 2020-12-02 ENCOUNTER — Telehealth: Payer: Self-pay

## 2020-12-02 NOTE — Telephone Encounter (Signed)
Called and left a Vm for patient to CB to schedule for gel injection with Dr. Tanda Rockers.  Approved for Orhtovisc, left  knee [place] Medicare deductible has been met Secondary insurance [place] will pick up remaining eligible expenses No Co-pay No PA required

## 2020-12-04 NOTE — Telephone Encounter (Signed)
Patient called and would like to move forward with the referral to ENT. She is requesting Thursdays or Fridays

## 2020-12-18 ENCOUNTER — Other Ambulatory Visit: Payer: Medicare Other

## 2020-12-18 ENCOUNTER — Inpatient Hospital Stay: Admission: RE | Admit: 2020-12-18 | Payer: Medicare Other | Source: Ambulatory Visit

## 2020-12-29 ENCOUNTER — Other Ambulatory Visit: Payer: Self-pay | Admitting: Family Medicine

## 2020-12-29 NOTE — Telephone Encounter (Signed)
Called patient and lvmtcb to schedule DM

## 2020-12-29 NOTE — Telephone Encounter (Signed)
2nd attempt  LMTCB to schedule a follow up

## 2020-12-29 NOTE — Telephone Encounter (Signed)
Please schedule diabetes follow up with Dr. Lorelei Pont.

## 2021-01-01 ENCOUNTER — Other Ambulatory Visit: Payer: Self-pay

## 2021-01-01 ENCOUNTER — Ambulatory Visit: Payer: Medicare Other | Admitting: Family Medicine

## 2021-01-01 ENCOUNTER — Ambulatory Visit (INDEPENDENT_AMBULATORY_CARE_PROVIDER_SITE_OTHER): Payer: Medicare Other | Admitting: Family Medicine

## 2021-01-01 ENCOUNTER — Encounter: Payer: Self-pay | Admitting: Family Medicine

## 2021-01-01 VITALS — BP 150/92 | HR 92 | Temp 98.3°F | Ht 66.5 in | Wt 299.2 lb

## 2021-01-01 DIAGNOSIS — E119 Type 2 diabetes mellitus without complications: Secondary | ICD-10-CM | POA: Diagnosis not present

## 2021-01-01 DIAGNOSIS — E1169 Type 2 diabetes mellitus with other specified complication: Secondary | ICD-10-CM

## 2021-01-01 LAB — POCT GLYCOSYLATED HEMOGLOBIN (HGB A1C): Hemoglobin A1C: 6.9 % — AB (ref 4.0–5.6)

## 2021-01-01 MED ORDER — GLIPIZIDE ER 5 MG PO TB24: 10.0000 mg | ORAL_TABLET | Freq: Every day | ORAL | 3 refills | Status: DC

## 2021-01-01 NOTE — Progress Notes (Signed)
[name] [name], MD, [place] at [facility] [place], [address]  Phone: [phone]   FAX: [place] - 65 y.o. female   MRN [id number]   Date of Birth: 04/18/1955  Date: 01/01/2021   PCP: Owens Loffler, MD   Referral: Owens Loffler, MD  Chief Complaint  Patient presents with   Diabetes    Here for DM and glipizide f/u.    This visit occurred during the SARS-CoV-2 public health emergency.  Safety protocols were in place, including screening questions prior to the visit, additional usage of staff PPE, and extensive cleaning of exam room while observing appropriate contact time as indicated for disinfecting solutions.   Subjective:   Summer Brown is a 65 y.o. very pleasant female patient with Body mass index is 47.57 kg/m. who presents with the following:  F/u DM.  Staying on all of her her meds.  She has been unable to tolerate the glipizide well. She is also been eating much better than before, she has been avoiding sugar and starches. The last time he checked of fasting blood sugar it was 130.  Lab Results  Component Value Date   HGBA1C 6.9 (A) 01/01/2021    Did have the flu recently.  150/92 this morning.  146/80 on my recheck. Right now she is not on any blood pressure medication.  Review of Systems is noted in the HPI, as appropriate  Objective:   BP (!) 150/92    Pulse 92    Temp 98.3 F (36.8 C) (Temporal)    Ht 5' 6.5" (1.689 m)    Wt 299 lb 3 oz (135.7 kg)    SpO2 96%    BMI 47.57 kg/m   GEN: No acute distress; alert,appropriate. PULM: Breathing comfortably in no respiratory distress PSYCH: Normally interactive.  CV: RRR, no m/g/r   Laboratory and Imaging Data: Results for orders placed or performed in visit on 01/01/21  POCT glycosylated hemoglobin (Hb A1C)  Result Value Ref Range   Hemoglobin A1C 6.9 (A) 4.0 - 5.6 %   HbA1c POC (<> result, manual entry)      HbA1c, POC (prediabetic range)     HbA1c, POC (controlled diabetic range)       Assessment and Plan:     ICD-10-CM   1. Type 2 diabetes mellitus with other specified complication, without long-term current use of insulin (HCC)  E11.69 POCT glycosylated hemoglobin (Hb A1C)    2. Controlled type 2 diabetes mellitus without complication, without long-term current use of insulin (HCC)  E11.9      Keep up glipizide.  Blood pressure is elevated some today, but for right now I just want her to keep on working on her diet and losing weight.  She has done a great job with diet changes.  Meds ordered this encounter  Medications   glipiZIDE (GLUCOTROL XL) 5 MG 24 hr tablet    Sig: Take 2 tablets (10 mg total) by mouth daily with breakfast.    Dispense:  180 tablet    Refill:  3   Medications Discontinued During This Encounter  Medication Reason   glipiZIDE (GLUCOTROL XL) 5 MG 24 hr tablet Reorder   Orders Placed This Encounter  Procedures   POCT glycosylated hemoglobin (Hb A1C)    Follow-up: Return in about 9 months (around 10/02/2021) for Medicare wellness and health maintenance.  Dragon Medical One speech-to-text software was used for transcription  in this dictation.  Possible transcriptional errors can occur using Editor, commissioning.   Signed,  Maud Deed. Itsel Opfer, MD   Outpatient Encounter Medications as of 01/01/2021  Medication Sig   Aspirin-Acetaminophen-Caffeine (EXCEDRIN PO) Take by mouth.   EPINEPHrine 0.3 mg/0.3 mL IJ SOAJ injection Inject 0.3 mg into the muscle as needed for anaphylaxis.   IBUPROFEN PO Take by mouth.   [DISCONTINUED] glipiZIDE (GLUCOTROL XL) 5 MG 24 hr tablet Take 2 tablets (10 mg total) by mouth daily with breakfast.   glipiZIDE (GLUCOTROL XL) 5 MG 24 hr tablet Take 2 tablets (10 mg total) by mouth daily with breakfast.   No facility-administered encounter medications on file as of 01/01/2021.

## 2021-01-23 ENCOUNTER — Other Ambulatory Visit: Payer: Medicare Other

## 2021-02-12 ENCOUNTER — Other Ambulatory Visit: Payer: Self-pay

## 2021-02-12 ENCOUNTER — Ambulatory Visit (INDEPENDENT_AMBULATORY_CARE_PROVIDER_SITE_OTHER): Payer: Medicare Other | Admitting: Allergy & Immunology

## 2021-02-12 ENCOUNTER — Encounter: Payer: Self-pay | Admitting: Allergy & Immunology

## 2021-02-12 VITALS — BP 140/74 | HR 107 | Temp 97.4°F

## 2021-02-12 DIAGNOSIS — J383 Other diseases of vocal cords: Secondary | ICD-10-CM

## 2021-02-12 DIAGNOSIS — R221 Localized swelling, mass and lump, neck: Secondary | ICD-10-CM

## 2021-02-12 NOTE — Patient Instructions (Addendum)
1. Throat swelling - Previous testing was negative to the entire food panel. - We are going to get some environmental allergy testing via the blood to see what might be going on. - We are going to look for hereditary angioedema (rare, but presents with episodes of intermittent swelling)  - We are going to send you to ENT so that they can look down your throat.  - EpiPen use reviewed today.  - Add on a daily antihistamine such as Zyrtec or Allegra to keep any allergic reactions at [place].   2. Return in about 6 months (around 08/12/2021).    Please inform us of any Emergency Department visits, hospitalizations, or changes in symptoms. Call us before going to the ED for breathing or allergy symptoms since we might be able to fit you in for a sick visit. Feel free to contact us anytime with any questions, problems, or concerns.  It was a pleasure to see you again today!  Websites that have reliable patient information: 1. American Academy of Asthma, Allergy, and Immunology: www.aaaai.org 2. Food Allergy Research and Education (FARE): foodallergy.org 3. Mothers of Asthmatics: http://www.asthmacommunitynetwork.org 4. American College of Allergy, Asthma, and Immunology: www.acaai.org   COVID-19 Vaccine Information can be found at: ShippingScam.co.uk For questions related to vaccine distribution or appointments, please email [email or web address] or call [phone].   We realize that you might be concerned about having an allergic reaction to the COVID19 vaccines. To help with that concern, WE ARE OFFERING THE COVID19 VACCINES IN OUR OFFICE! Ask the front desk for dates!     Like [place] on [place] and Instagram for our latest updates!      A healthy democracy works best when [employer] participate! Make sure you are registered to vote! If you have moved or changed any of your contact information, you will need to get this updated  before voting!  In some cases, you MAY be able to register to vote online: [email or web address]

## 2021-02-12 NOTE — Progress Notes (Signed)
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 Throat swelling - Previous testing was negative to the entire food panel. - We are going to get some environmental allergy testing via the blood to see what might be going on. - We are going to look for hereditary angioedema (rare, but presents with episodes of intermittent swelling)  - We are going to send you to ENT so that they can look down your throat.  - EpiPen use reviewed today.  - Add on a daily antihistamine such as Zyrtec or Allegra to keep any allergic reactions at [place].   2. Return in about 6 months (around 08/12/2021).   Subjective:   Summer Brown is a 66 y.o. female presenting today for follow up of  Chief Complaint  Patient presents with   Follow-up   Angioedema    Summer Brown has a history of the following: Patient Active Problem List   Diagnosis Date Noted   Controlled type 2 diabetes mellitus without complication, without long-term current use of insulin ([place]) 11/06/2016   Morbid obesity ([place]) 08/07/2014   Chronic venous insufficiency 08/07/2014   Chronic back pain 12/12/2013    History obtained from: chart review and patient.  Summer Brown is a 66 y.o. female presenting for a follow up visit.  She was last seen in October 2022 for evaluation of throat swelling.  She had testing to the entire food panel which was negative.  We obtained some lab work including tryptase and alpha gal which was negative.  Since the last visit, she has done well. She has not had any episodes at all since the last visit. She has not seen ENT at all. She does have two EpiPen. She has not had to use the EpiPens at all. She keeps them in her bag just in case. Overall symptoms have  been ongoing for 2 years.   These episodes last 3 to 4 minutes and she will be unable to talk during that time.  She has never passed out during these episodes.  She does not remember turning blue during any of these episodes.  She does not have a pulse ox, so has never been able to monitor her oxygen during these episodes. Reactions can occur at work or home.  It does not seem to be rhyme or reason to where they occur.  When she demonstrates them in the office to recreate the sound, they seem like more of a forced wheeze.  She has four dogs and has had dogs for her entire life.  She definitely does not think it is the dogs or another animal for that matter.   She has not seen ENT.  She did have ear surgery when she was in high school due to an injury sustained from a volleyball.  Otherwise, she has not seen an ENT physician anytime recently.  Otherwise, there have been no changes to her past medical history, surgical history, family history, or social history.    Review of Systems  Constitutional: Negative.  Negative for fever, malaise/fatigue and weight loss.  HENT:  Positive for sore throat. Negative for congestion, ear discharge and ear pain.   Eyes:  Negative for pain, discharge and redness.  Respiratory:  Negative for cough,  sputum production, shortness of breath and wheezing.   Cardiovascular: Negative.  Negative for chest pain and palpitations.  Gastrointestinal:  Negative for abdominal pain, heartburn, nausea and vomiting.  Skin: Negative.  Negative for itching and rash.  Neurological:  Negative for dizziness and headaches.  Endo/Heme/Allergies:  Negative for environmental allergies. Does not bruise/bleed easily.      Objective:   Blood pressure 140/74, pulse (!) 107, temperature (!) 97.4 F (36.3 C), temperature source Temporal, SpO2 98 %. There is no height or weight on file to calculate BMI.   Physical Exam:  Physical Exam Vitals reviewed.  Constitutional:       Appearance: She is well-developed.  HENT:     Head: Normocephalic and atraumatic.     Right Ear: Tympanic membrane, ear canal and external ear normal. No drainage, swelling or tenderness. Tympanic membrane is not injected, scarred, erythematous, retracted or bulging.     Left Ear: Tympanic membrane, ear canal and external ear normal. No drainage, swelling or tenderness. Tympanic membrane is not injected, scarred, erythematous, retracted or bulging.     Nose: No nasal deformity, septal deviation, mucosal edema or rhinorrhea.     Right Turbinates: Not enlarged, swollen or pale.     Left Turbinates: Not enlarged, swollen or pale.     Right Sinus: No maxillary sinus tenderness or frontal sinus tenderness.     Left Sinus: No maxillary sinus tenderness or frontal sinus tenderness.     Mouth/Throat:     Mouth: Mucous membranes are not pale and not dry.     Pharynx: Uvula midline.     Tonsils: No tonsillar exudate. 1+ on the right. 1+ on the left.     Comments: Oropharynx appears within normal limits. Eyes:     General:        Right eye: No discharge.        Left eye: No discharge.     Conjunctiva/sclera: Conjunctivae normal.     Right eye: Right conjunctiva is not injected. No chemosis.    Left eye: Left conjunctiva is not injected. No chemosis.    Pupils: Pupils are equal, round, and reactive to light.  Cardiovascular:     Rate and Rhythm: Normal rate and regular rhythm.     Heart sounds: Normal heart sounds.  Pulmonary:     Effort: Pulmonary effort is normal. No tachypnea, accessory muscle usage or respiratory distress.     Breath sounds: Normal breath sounds. No wheezing, rhonchi or rales.     Comments: Moving air well in all lung fields. No increased work of breathing noted.  Chest:     Chest wall: No tenderness.  Lymphadenopathy:     Head:     Right side of head: No submandibular, tonsillar or occipital adenopathy.     Left side of head: No submandibular, tonsillar or occipital  adenopathy.     Cervical: No cervical adenopathy.  Skin:    Coloration: Skin is not pale.     Findings: No abrasion, erythema, petechiae or rash. Rash is not papular, urticarial or vesicular.  Neurological:     Mental Status: She is alert.  Psychiatric:        Behavior: Behavior is cooperative.     Diagnostic studies: labs to rule out hereditary angioedema sent as well as an environmental allergy panel       Salvatore Marvel, MD  Allergy and [employer]

## 2021-02-13 ENCOUNTER — Encounter: Payer: Self-pay | Admitting: Allergy & Immunology

## 2021-02-17 LAB — ALLERGENS W/COMP RFLX AREA 2
Alternaria Alternata IgE: <0.1 kU/L
Aspergillus Fumigatus IgE: <0.1 kU/L
Bermuda Grass IgE: <0.1 kU/L
Cedar, Mountain IgE: <0.1 kU/L
Cladosporium Herbarum IgE: <0.1 kU/L
Cockroach, German IgE: <0.1 kU/L
Common Silver Birch IgE: <0.1 kU/L
Cottonwood IgE: <0.1 kU/L
D Farinae IgE: <0.1 kU/L
D Pteronyssinus IgE: <0.1 kU/L
E001-IgE Cat Dander: <0.1 kU/L
E005-IgE Dog Dander: <0.1 kU/L
Elm, American IgE: <0.1 kU/L
IgE (Immunoglobulin E), Serum: 38 IU/mL (ref 6–495)
Johnson Grass IgE: <0.1 kU/L
Maple/Box Elder IgE: <0.1 kU/L
Mouse Urine IgE: <0.1 kU/L
Oak, White IgE: <0.1 kU/L
Pecan, Hickory IgE: <0.1 kU/L
Penicillium Chrysogen IgE: <0.1 kU/L
Pigweed, Rough IgE: <0.1 kU/L
Ragweed, Short IgE: <0.1 kU/L
Sheep Sorrel IgE Qn: <0.1 kU/L
Timothy Grass IgE: <0.1 kU/L
White Mulberry IgE: <0.1 kU/L

## 2021-02-17 LAB — COMPLEMENT COMPONENT C1Q: Complement C1Q: 15.8 mg/dL (ref 10.3–20.5)

## 2021-02-17 LAB — C1 ESTERASE INHIBITOR: C1INH SerPl-mCnc: 29 mg/dL (ref 21–39)

## 2021-02-17 LAB — C1 ESTERASE INHIBITOR, FUNCTIONAL: C1INH Functional/C1INH Total MFr SerPl: 91 %mean normal

## 2021-02-19 ENCOUNTER — Telehealth: Payer: Self-pay

## 2021-02-19 NOTE — Telephone Encounter (Signed)
Patient is scheduled to see Dr Mila Homer on 04-14-2021 @ 10:20 then @ 10:45 to do a voice evaluation. New patient packet has been mailed out by their office.  Patient has been informed of this information.   ENT/Head and Neck Surgery - [facility] Address: [place], [place] [address] Appointments [phone])

## 2021-02-19 NOTE — Telephone Encounter (Signed)
-----   Message from Valentina Shaggy, MD sent at 02/13/2021  5:35 AM EST ----- Can you refer to [facility]NT with Dr. Johnna Acosta for evaluation of vocal cord dysfunction?

## 2021-02-23 NOTE — Telephone Encounter (Signed)
Awesome - thank you!   Salvatore Marvel, MD Allergy and [place] [facility]

## 2021-03-05 ENCOUNTER — Ambulatory Visit
Admission: RE | Admit: 2021-03-05 | Discharge: 2021-03-05 | Disposition: A | Discharge status: Home / Self Care | Payer: Medicare Other | Source: Ambulatory Visit | Attending: Family Medicine | Admitting: Family Medicine

## 2021-03-05 ENCOUNTER — Other Ambulatory Visit: Payer: Self-pay

## 2021-03-05 DIAGNOSIS — R928 Other abnormal and inconclusive findings on diagnostic imaging of breast: Secondary | ICD-10-CM

## 2021-04-14 DIAGNOSIS — R09A2 Foreign body sensation, throat: Secondary | ICD-10-CM | POA: Insufficient documentation

## 2021-04-14 DIAGNOSIS — J383 Other diseases of vocal cords: Secondary | ICD-10-CM | POA: Insufficient documentation

## 2021-04-14 DIAGNOSIS — R0989 Other specified symptoms and signs involving the circulatory and respiratory systems: Secondary | ICD-10-CM | POA: Insufficient documentation

## 2021-04-14 DIAGNOSIS — J385 Laryngeal spasm: Secondary | ICD-10-CM | POA: Insufficient documentation

## 2021-08-13 ENCOUNTER — Encounter: Payer: Self-pay | Admitting: Allergy & Immunology

## 2021-08-13 ENCOUNTER — Ambulatory Visit (INDEPENDENT_AMBULATORY_CARE_PROVIDER_SITE_OTHER): Payer: Medicare Other | Admitting: Allergy & Immunology

## 2021-08-13 VITALS — BP 130/82 | HR 100 | Temp 98.7°F | Resp 18 | Ht 66.5 in | Wt 299.0 lb

## 2021-08-13 DIAGNOSIS — R221 Localized swelling, mass and lump, neck: Secondary | ICD-10-CM

## 2021-08-13 DIAGNOSIS — J383 Other diseases of vocal cords: Secondary | ICD-10-CM

## 2021-08-13 NOTE — Patient Instructions (Addendum)
1. Sensation of throat swelling - I think a lot of this is related to the vocal cord issues. - It seems that you have a good way to do your own therapy to get everything to relax.  - You seem to have a good handle on your symptoms.  - We can refer you to see Speech Therapy somewhere locally if you think this might be helpful.  - You can try giving yourself the EpiPen to see if this helps (this would be VERY instructive to see if this is allergy mediated).  - Use an antihistamine DAILY to see if this helps (the idea to keep an antihistamine in your body to see if this BLUNTS an allergic reaction).   2. Return in about 6 months (around 02/13/2022).    Please inform us of any Emergency Department visits, hospitalizations, or changes in symptoms. Call us before going to the ED for breathing or allergy symptoms since we might be able to fit you in for a sick visit. Feel free to contact us anytime with any questions, problems, or concerns.  It was a pleasure to see you again today!  Websites that have reliable patient information: 1. American Academy of Asthma, Allergy, and Immunology: www.aaaai.org 2. Food Allergy Research and Education (FARE): foodallergy.org 3. Mothers of Asthmatics: http://www.asthmacommunitynetwork.org 4. American College of Allergy, Asthma, and Immunology: www.acaai.org   COVID-19 Vaccine Information can be found at: ShippingScam.co.uk For questions related to vaccine distribution or appointments, please email [email or web address] or call [phone].   We realize that you might be concerned about having an allergic reaction to the COVID19 vaccines. To help with that concern, WE ARE OFFERING THE COVID19 VACCINES IN OUR OFFICE! Ask the front desk for dates!     "Like" [place] on Facebook and Instagram for our latest updates!      A healthy democracy works best when [employer] participate! Make sure you are  registered to vote! If you have moved or changed any of your contact information, you will need to get this updated before voting!  In some cases, you MAY be able to register to vote online: [email or web address]

## 2021-08-13 NOTE — Progress Notes (Signed)
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 Sensation of throat swelling - I think a lot of this is related to the vocal cord issues. - It seems that you have a good way to do your own therapy to get everything to relax.  - You seem to have a good handle on your symptoms.  - We can refer you to see Speech Therapy somewhere locally if you think this might be helpful.  - You can try giving yourself the EpiPen to see if this helps (this would be VERY instructive to see if this is allergy mediated).  - Use an antihistamine DAILY to see if this helps (the idea to keep an antihistamine in your body to see if this BLUNTS an allergic reaction).   2. Return in about 6 months (around 02/13/2022).   Subjective:   Summer Brown is a 66 y.o. female presenting today for follow up of  Chief Complaint  Patient presents with   Follow-up    Summer Brown has a history of the following: Patient Active Problem List   Diagnosis Date Noted   Controlled type 2 diabetes mellitus without complication, without long-term current use of insulin ([place]) 11/06/2016   Morbid obesity ([place]) 08/07/2014   Chronic venous insufficiency 08/07/2014   Chronic back pain 12/12/2013    History obtained from: chart review and patient.  Peg is a 66 y.o. female presenting for a follow up visit.  She was last seen in January 2023.  At that time, she was endorsing some throat swelling.  She had previous testing that was negative to the entire food panel.  We got some environmental allergy testing via the blood.  We also look for hereditary angioedema.  We referred her to ENT due to concern for vocal cord  dysfunction.  We added on a daily antihistamine. The entire lab workup was normal.   In the interim, she did go see Dr. Rowe Clack and she was diagnosed with paradoxical vocal fold motion disorder.  Speech therapy was recommended.  Since the last visit, she has continued to have issues. She continues to have episodes of throat closure. She has to think about swimming the length of the pool and back. This helps her to get through the episodes. It is very scary when it does happen. She has to keep herself calm to get her throat to open up again. This is not once a month. It might happen 2-3 times per year. She has never had to leave the EpiPen. She does carry it with her, but she cannot figure out what is causing it.   She is not using an antihistamine on a routine basis. She will have a headache from the pollen outdoors. She does not do it daily at all. She has a number of dogs at home that she has adopted. However, testing has been negative in the past.   Otherwise, there have been no changes to her past medical history, surgical history, family history, or social history.    Review of Systems  Constitutional: Negative.  Negative for fever, malaise/fatigue and weight loss.  HENT:  Negative for congestion, ear discharge, ear  pain and sore throat.   Eyes:  Negative for pain, discharge and redness.  Respiratory:  Negative for cough, sputum production, shortness of breath and wheezing.   Cardiovascular: Negative.  Negative for chest pain and palpitations.  Gastrointestinal:  Negative for abdominal pain, constipation, diarrhea, heartburn, nausea and vomiting.  Skin: Negative.  Negative for itching and rash.  Neurological:  Negative for dizziness and headaches.  Endo/Heme/Allergies:  Negative for environmental allergies. Does not bruise/bleed easily.       Objective:   Blood pressure 130/82, pulse 100, temperature 98.7 F (37.1 C), resp. rate 18, height 5' 6.5" (1.689 m), weight 299 lb (135.6 kg),  SpO2 98 %. Body mass index is 47.54 kg/m.    Physical Exam Vitals reviewed.  Constitutional:      Appearance: She is well-developed.     Comments: Very talkative.  HENT:     Head: Normocephalic and atraumatic.     Right Ear: Tympanic membrane, ear canal and external ear normal. No drainage, swelling or tenderness. Tympanic membrane is not injected, scarred, erythematous, retracted or bulging.     Left Ear: Tympanic membrane, ear canal and external ear normal. No drainage, swelling or tenderness. Tympanic membrane is not injected, scarred, erythematous, retracted or bulging.     Nose: No nasal deformity, septal deviation, mucosal edema or rhinorrhea.     Right Turbinates: Enlarged, swollen and pale.     Left Turbinates: Enlarged, swollen and pale.     Right Sinus: No maxillary sinus tenderness or frontal sinus tenderness.     Left Sinus: No maxillary sinus tenderness or frontal sinus tenderness.     Mouth/Throat:     Mouth: Mucous membranes are not pale and not dry.     Pharynx: Uvula midline.     Tonsils: No tonsillar exudate. 1+ on the right. 1+ on the left.     Comments: Oropharynx appears within normal limits. Eyes:     General: Allergic shiner present.        Right eye: No discharge.        Left eye: No discharge.     Conjunctiva/sclera: Conjunctivae normal.     Right eye: Right conjunctiva is not injected. No chemosis.    Left eye: Left conjunctiva is not injected. No chemosis.    Pupils: Pupils are equal, round, and reactive to light.  Cardiovascular:     Rate and Rhythm: Normal rate and regular rhythm.     Heart sounds: Normal heart sounds.  Pulmonary:     Effort: Pulmonary effort is normal. No tachypnea, accessory muscle usage or respiratory distress.     Breath sounds: Normal breath sounds. No wheezing, rhonchi or rales.     Comments: Moving air well in all lung fields. No increased work of breathing noted.  Chest:     Chest wall: No tenderness.  Lymphadenopathy:      Head:     Right side of head: No submandibular, tonsillar or occipital adenopathy.     Left side of head: No submandibular, tonsillar or occipital adenopathy.     Cervical: No cervical adenopathy.  Skin:    Coloration: Skin is not pale.     Findings: No abrasion, erythema, petechiae or rash. Rash is not papular, urticarial or vesicular.  Neurological:     Mental Status: She is alert.  Psychiatric:        Behavior: Behavior is cooperative.      Diagnostic studies: none       Salvatore Marvel, MD  [facility]

## 2021-08-19 ENCOUNTER — Emergency Department (HOSPITAL_COMMUNITY)
Admission: EM | Admit: 2021-08-19 | Discharge: 2021-08-19 | Disposition: A | Discharge status: Home / Self Care | Payer: Medicare Other | Attending: Emergency Medicine | Admitting: Emergency Medicine

## 2021-08-19 ENCOUNTER — Emergency Department (HOSPITAL_COMMUNITY): Payer: Medicare Other

## 2021-08-19 ENCOUNTER — Other Ambulatory Visit: Payer: Self-pay

## 2021-08-19 ENCOUNTER — Encounter (HOSPITAL_COMMUNITY): Payer: Self-pay | Admitting: Emergency Medicine

## 2021-08-19 ENCOUNTER — Telehealth: Payer: Self-pay

## 2021-08-19 DIAGNOSIS — R1031 Right lower quadrant pain: Secondary | ICD-10-CM | POA: Insufficient documentation

## 2021-08-19 DIAGNOSIS — E119 Type 2 diabetes mellitus without complications: Secondary | ICD-10-CM | POA: Insufficient documentation

## 2021-08-19 DIAGNOSIS — R110 Nausea: Secondary | ICD-10-CM | POA: Diagnosis not present

## 2021-08-19 DIAGNOSIS — Z7984 Long term (current) use of oral hypoglycemic drugs: Secondary | ICD-10-CM | POA: Diagnosis not present

## 2021-08-19 LAB — URINALYSIS, ROUTINE W REFLEX MICROSCOPIC
Bilirubin Urine: NEGATIVE
Glucose, UA: NEGATIVE mg/dL
Hgb urine dipstick: NEGATIVE
Ketones, ur: NEGATIVE mg/dL
Leukocytes,Ua: NEGATIVE
Nitrite: NEGATIVE
Protein, ur: NEGATIVE mg/dL
Specific Gravity, Urine: 1.021 (ref 1.005–1.030)
pH: 6 (ref 5.0–8.0)

## 2021-08-19 LAB — COMPREHENSIVE METABOLIC PANEL
ALT: 22 U/L (ref 0–44)
AST: 21 U/L (ref 15–41)
Albumin: 3.7 g/dL (ref 3.5–5.0)
Alkaline Phosphatase: 55 U/L (ref 38–126)
Anion gap: 5 (ref 5–15)
BUN: 11 mg/dL (ref 8–23)
CO2: 27 mmol/L (ref 22–32)
Calcium: 9.2 mg/dL (ref 8.9–10.3)
Chloride: 105 mmol/L (ref 98–111)
Creatinine, Ser: 0.7 mg/dL (ref 0.44–1.00)
GFR, Estimated: >60 mL/min (ref >60)
Glucose, Bld: 110 mg/dL — ABNORMAL HIGH (ref 70–99)
Potassium: 3.6 mmol/L (ref 3.5–5.1)
Sodium: 137 mmol/L (ref 135–145)
Total Bilirubin: 0.9 mg/dL (ref 0.3–1.2)
Total Protein: 7.1 g/dL (ref 6.5–8.1)

## 2021-08-19 LAB — CBC
HCT: 42 % (ref 36.0–46.0)
Hemoglobin: 14.9 g/dL (ref 12.0–15.0)
MCH: 32.2 pg (ref 26.0–34.0)
MCHC: 35.5 g/dL (ref 30.0–36.0)
MCV: 90.7 fL (ref 80.0–100.0)
Platelets: 243 10*3/uL (ref 150–400)
RBC: 4.63 MIL/uL (ref 3.87–5.11)
RDW: 12.7 % (ref 11.5–15.5)
WBC: 6.8 10*3/uL (ref 4.0–10.5)
nRBC: 0 % (ref 0.0–0.2)

## 2021-08-19 LAB — LIPASE, BLOOD: Lipase: 23 U/L (ref 11–51)

## 2021-08-19 MED ORDER — SODIUM CHLORIDE 0.9 % IV BOLUS
500.0000 mL | Freq: Once | INTRAVENOUS | Status: AC
  Administered 2021-08-19: 500 mL via INTRAVENOUS

## 2021-08-19 MED ORDER — DIPHENHYDRAMINE HCL 50 MG/ML IJ SOLN
25.0000 mg | Freq: Once | INTRAMUSCULAR | Status: AC
Start: 2021-08-19 — End: 2021-08-19
  Administered 2021-08-19: 25 mg via INTRAVENOUS
  Filled 2021-08-19: qty 1

## 2021-08-19 MED ORDER — HYDROMORPHONE HCL 1 MG/ML IJ SOLN
1.0000 mg | Freq: Once | INTRAMUSCULAR | Status: AC
  Administered 2021-08-19: 1 mg via INTRAVENOUS
  Filled 2021-08-19: qty 1

## 2021-08-19 MED ORDER — ONDANSETRON HCL 4 MG/2ML IJ SOLN
4.0000 mg | Freq: Once | INTRAMUSCULAR | Status: AC
  Administered 2021-08-19: 4 mg via INTRAVENOUS
  Filled 2021-08-19: qty 2

## 2021-08-19 MED ORDER — IOHEXOL 300 MG/ML  SOLN
100.0000 mL | Freq: Once | INTRAMUSCULAR | Status: AC | PRN
  Administered 2021-08-19: 100 mL via INTRAVENOUS

## 2021-08-19 NOTE — ED Provider Notes (Signed)
Care handoff from Cherlynn June, PA-C at shift change. Please see their note for further information.  Briefly: Patient presents today with acute onset 10/10 right sided abdominal pain at 11 am this morning. No n/v/d. Hx large ovarian cyst rupture that required emergent surgery many years ago, states this pain is similar.  Ddx: appendicitis, ovarian cyst, cholecystitis, pancreatitis, colitis, and others  Plan: Labs overall normal. UA and CT scan pending. If normal will need [place] to look at ovaries.   CT reveals no acute findings.  I have personally reviewed and interpreted this imaging and agree with radiology interpretation.  Upon reassessment of the patient, she is tender throughout her right side from her RUQ to her RLQ. Given this, will get pelvic and abdominal US for further evaluation.   Pelvic US shows:   1. Normal size and appearance of the uterus. 2. Endometrial stripe thickness of 8 mm with tiny heterogeneous cystic collections, possibly due to endometrial atrophy. 3. Ovaries are not visualized.  No abnormal adnexal masses are seen.  Abdominal US shows:  1. No evidence of cholelithiasis or acute cholecystitis. 2. Nodular contour to the liver may indicate cirrhosis. Benign-appearing cyst. No additional imaging follow-up is indicated for this lesion. 3. Examination is otherwise unremarkable.  I have personally reviewed and interpreted this imaging and agree with radiology interpretation.  UA reveals no acute findings.   Upon reassessment patient states that she is feeling much better. Patient is nontoxic, nonseptic appearing, in no apparent distress.  Patient's pain and other symptoms adequately managed in emergency department.  Fluid bolus given.  Labs, imaging and vitals reviewed.  Patient does not meet the SIRS or Sepsis criteria.  On repeat exam patient does not have a surgical abdomin and there are no peritoneal signs.  No indication of appendicitis, bowel obstruction,  bowel perforation, cholecystitis, diverticulitis, PID or ectopic pregnancy.  Patient discharged home with symptomatic treatment and given strict instructions for follow-up with their primary care physician.  She also states that she has not had a screening colonoscopy. I have strongly recommended that she have one especially given that she is having abdominal symptoms. Given GI referral for same. I have also discussed reasons to return immediately to the ER.  Patient expresses understanding and agrees with plan. Discharged in stable condition.  Findings and plan of care discussed with supervising physician Dr. Roslynn Amble who is in agreement.          Nestor Lewandowsky 08/19/21 2126    Lucrezia Starch, MD 08/19/21 [phone]

## 2021-08-19 NOTE — ED Provider Notes (Signed)
[facility] EMERGENCY DEPARTMENT Provider Note   CSN: [id number] Arrival date & time: 08/19/21  1123     History  Chief Complaint  Patient presents with   Groin Pain   Abdominal Pain    Summer Brown is a 66 y.o. female.  Patient presents complaining of right-sided abdominal pain that began at 11 this morning while standing in her kitchen.  The patient states she had a sudden onset of sharp pain on the right side, close to the right lower quadrant.  She states she previously had an ovarian cyst on the right side that was found to be the size of a grapefruit which had ruptured, requiring emergent surgery.  The patient reports a sudden onset of pain rated 10 out of 10.  She states she also felt nauseated but denies emesis, diarrhea, constipation.  Her chief complaint is the pain which has been reported as constant.  She denies vaginal bleeding, dysuria, chest pain, shortness of breath, headache.  Past medical history significant for controlled type 2 diabetes, chronic back pain, ovarian cyst surgery  HPI     Home Medications Prior to Admission medications   Medication Sig Start Date End Date Taking? Authorizing Provider  Aspirin-Acetaminophen-Caffeine (EXCEDRIN PO) Take by mouth.    [provider]  EPINEPHrine 0.3 mg/0.3 mL IJ SOAJ injection Inject 0.3 mg into the muscle as needed for anaphylaxis. 08/28/20   Copland, Frederico Hamman, MD  glipiZIDE (GLUCOTROL XL) 5 MG 24 hr tablet Take 2 tablets (10 mg total) by mouth daily with breakfast. 01/01/21   Copland, Frederico Hamman, MD  IBUPROFEN PO Take by mouth.    [provider]      Allergies    Codeine    Review of Systems   Review of Systems  Constitutional:  Negative for fever.  Respiratory:  Negative for shortness of breath.   Cardiovascular:  Negative for chest pain.  Gastrointestinal:  Positive for abdominal pain and nausea. Negative for constipation, diarrhea and vomiting.  Genitourinary:  Negative for  dysuria, flank pain, pelvic pain and vaginal bleeding.  Neurological:  Negative for syncope and headaches.    Physical Exam Updated Vital Signs BP (!) 153/98 (BP Location: Right Arm)   Pulse 84   Temp 98.3 F (36.8 C) (Oral)   Resp 18   SpO2 97%  Physical Exam Vitals and nursing note reviewed.  Constitutional:      General: She is not in acute distress.    Appearance: She is obese.  HENT:     Head: Normocephalic and atraumatic.  Eyes:     Extraocular Movements: Extraocular movements intact.  Cardiovascular:     Rate and Rhythm: Normal rate and regular rhythm.     Heart sounds: Normal heart sounds.  Pulmonary:     Effort: Pulmonary effort is normal.     Breath sounds: Normal breath sounds.  Abdominal:     General: Abdomen is flat. Bowel sounds are normal. There is no distension.     Palpations: Abdomen is soft.     Tenderness: There is abdominal tenderness in the right lower quadrant. There is no right CVA tenderness or left CVA tenderness.  Skin:    General: Skin is warm and dry.     Capillary Refill: Capillary refill takes less than 2 seconds.  Neurological:     Mental Status: She is alert and oriented to person, place, and time.    ED Results / Procedures / Treatments   Labs (all labs ordered  are listed, but only abnormal results are displayed) Labs Reviewed  LIPASE, BLOOD  COMPREHENSIVE METABOLIC PANEL  CBC  URINALYSIS, ROUTINE W REFLEX MICROSCOPIC    EKG None  Radiology No results found.  Procedures Procedures    Medications Ordered in ED Medications  HYDROmorphone (DILAUDID) injection 1 mg (has no administration in time range)  ondansetron (ZOFRAN) injection 4 mg (has no administration in time range)    ED Course/ Medical Decision Making/ A&P                           Medical Decision Making Amount and/or Complexity of Data Reviewed Labs: ordered. Radiology: ordered.  Risk Prescription drug management.   This patient presents to the ED  for concern of right-sided abdominal pain, this involves an extensive number of treatment options, and is a complaint that carries with it a high risk of complications and morbidity.  The differential diagnosis includes appendicitis, ovarian cyst, cholecystitis, pancreatitis, colitis, and others   Co morbidities that complicate the patient evaluation  Patient with history of ovarian cyst with previous rupture requiring surgery   Additional history obtained:   External records from outside source obtained and reviewed including primary care records showing treatment for type 2 diabetes   Lab Tests:  I Ordered, and personally interpreted labs.  The pertinent results include: Grossly normal CBC, lipase 23, creatinine 0.7 with GFR greater than 60   Imaging Studies ordered:  I ordered imaging studies including CT abdomen pelvis with contrast   Problem List / ED Course / Critical interventions / Medication management   I ordered medication including Dilaudid for pain and Zofran for nausea Reevaluation of the patient after these medicines showed that the patient improved I have reviewed the patients home medicines and have made adjustments as needed   Test / Admission - Considered:  Patient care being transferred to Lavonna Rua, PA-C at shift handoff.  Patient disposition pending results of CT and labs        Final Clinical Impression(s) / ED Diagnoses Final diagnoses:  None    Rx / DC Orders ED Discharge Orders     None         Ronny Bacon 08/19/21 1544    Charlesetta Shanks, MD 08/27/21 2223

## 2021-08-19 NOTE — ED Notes (Signed)
Pt ambulated to bathroom with assistance by this RN 

## 2021-08-19 NOTE — Telephone Encounter (Signed)
[facility] - Client TELEPHONE ADVICE RECORD Access[facility]urse Patient Name: Summer Brown Gender: Female DOB: [birth year] Age: 66 Y 26 D Return Phone Number: [phone] (Primary) Address: City/ State/ Zip: [place]  [address] Client [facility] - Client Client Site [place] - Day Provider Copland, Frederico Hamman - MD Contact Type Call Who Is Calling Patient / Member / Family / Caregiver Call Type Triage / Clinical Relationship To Patient Self Return Phone Number [phone] (Primary) Chief Complaint SEVERE ABDOMINAL PAIN - Severe pain in abdomen Reason for Call Symptomatic / Request for Health Information Initial Comment Caller states she is calling from the office with a pt who is having sharp right pain on the right side and worried because she has a cyst burst before. Caller states she was standing and felt the severe pain and she not sure if its another one is there but it hurt really bad. Caller states she is laying down now and the pain is easing but she is worried. Translation No Nurse Assessment Nurse: Ellery Plunk, RN, Danica Date/Time (Eastern Time): 08/19/2021 10:31:43 AM Confirm and document reason for call. If symptomatic, describe symptoms. ---Caller states she was having 10/10 pain on her lower right side suddenly 30 minutes ago. Has a hx of cysts on her ovaries. Laying down now and feeling a little better Does the patient have any new or worsening symptoms? ---Yes Will a triage be completed? ---Yes Related visit to physician within the last 2 weeks? ---No Does the PT have any chronic conditions? (i.e. diabetes, asthma, this includes High risk factors for pregnancy, etc.) ---Yes List chronic conditions. ---diabetes Is this a behavioral health or substance abuse call? ---No Guidelines Guideline Title Affirmed Question Affirmed Notes Nurse Date/Time (Eastern Time) Pelvic Pain -  Female Patient sounds very sick or weak to the triager Bringas, RN, Fredric Dine 08/19/2021 10:32:58 AM PLEASE NOTE: All timestamps contained within this report are represented as [place] Standard Time. CONFIDENTIALTY NOTICE: This fax transmission is intended only for the addressee. It contains information that is legally privileged, confidential or otherwise protected from use or disclosure. If you are not the intended recipient, you are strictly prohibited from reviewing, disclosing, copying using or disseminating any of this information or taking any action in reliance on or regarding this information. If you have received this fax in error, please notify us immediately by telephone so that we can arrange for its return to [place]. Phone: [phone], Toll-Free: [phone], Fax: [phone] Page: 2 of 2 Call Id: [id number] [place]. Time Eilene Ghazi Time) Disposition Final User 08/19/2021 10:29:54 AM Send to Urgent Sharlet [place]ina, Chloe-Jade 08/19/2021 10:35:21 AM Go to ED Now (or PCP triage) Yes Ellery Plunk, RN, Danica Final Disposition 08/19/2021 10:35:21 AM Go to ED Now (or PCP triage) Yes Ellery Plunk, RN, Danica Caller Disagree/Comply Comply Caller Understands Yes PreDisposition InappropriateToAsk Care Advice Given Per Guideline GO TO ED NOW (OR PCP TRIAGE): * IF NO PCP (PRIMARY CARE PROVIDER) SECOND-LEVEL TRIAGE: You need to be seen within the next hour. Go to the [facility] at _____________ [place] as soon as you can. CARE ADVICE given per Pelvic Pain, Female (Adult) guideline. ANOTHER ADULT SHOULD DRIVE: * It is better and safer if another adult drives instead of you. Referrals [place]

## 2021-08-19 NOTE — ED Triage Notes (Signed)
Patient complains of right sided pelvic pain that started at approximately 1100 this morning while standing in her kitchen. Patient reports history of ovarian cysts that caused a similar pain previously when it ruptured. Patient is alert, oriented, and in no apparent distress at this time.

## 2021-08-19 NOTE — Telephone Encounter (Signed)
I spoke with pt; pt is driving herself to [facility] ED; pt sounded like in a lot of pain but she said she was almost to hospital now. No OB GYN seen for many years per pt. Pt said she can make it to hospital. Sending note as FYI to Dr Lorelei Pont and Butch Penny CMA.

## 2021-08-19 NOTE — ED Notes (Signed)
Patient transported to Ultrasound 

## 2021-08-19 NOTE — Discharge Instructions (Addendum)
As we discussed, your work-up in the ER today was reassuring for acute abnormalities.  Laboratory evaluation and imaging did not reveal any emergent concerns today.  I strongly recommend that you call your primary care doctor tomorrow to schedule an appointment for further further evaluation and management of your symptoms.  I also strongly recommend that you establish care with a GI doctor to schedule a screening colonoscopy as the beginning age for this is 37.  I have given you a referral for GI to help facilitate this.   Return if development of any new or worsening symptoms

## 2021-08-19 NOTE — ED Notes (Signed)
RN reviewed discharge instructions with pt. Pt verbalized understanding and had no further questions. VSS upon discharge.  

## 2021-08-20 ENCOUNTER — Telehealth: Payer: Self-pay | Admitting: Family Medicine

## 2021-08-20 ENCOUNTER — Telehealth: Payer: Self-pay

## 2021-08-20 NOTE — Telephone Encounter (Signed)
Left message for patient to call back and schedule Medicare Annual Wellness Visit (AWV).   Please offer to do virtually or by telephone.   Last AWV:08/28/2020  Please schedule at anytime with [facility] schedule 2  45 minute appointent  If any questions, please contact me at [phone]

## 2021-08-20 NOTE — Telephone Encounter (Signed)
Summer Flock, MD  Yevette Edwards, RN [facility] can you please forward to schedulers for an ED follow up consult for abdominal pain / colon cancer screening. Can see anyone. Thanks

## 2021-08-20 NOTE — Telephone Encounter (Signed)
Called and spoke with patient. I offered to schedule her for an appt on 09/16/21. Pt states that she will need to check her work schedule before she schedules an appt. Pt will call us back when she is ready to schedule.

## 2021-09-17 ENCOUNTER — Ambulatory Visit (INDEPENDENT_AMBULATORY_CARE_PROVIDER_SITE_OTHER): Payer: Medicare Other

## 2021-09-17 VITALS — Ht 66.5 in | Wt 299.0 lb

## 2021-09-17 DIAGNOSIS — Z1211 Encounter for screening for malignant neoplasm of colon: Secondary | ICD-10-CM | POA: Diagnosis not present

## 2021-09-17 DIAGNOSIS — Z0000 Encounter for general adult medical examination without abnormal findings: Secondary | ICD-10-CM

## 2021-09-17 NOTE — Addendum Note (Signed)
Addended by: Dionisio David on: 09/17/2021 02:03 PM   Modules accepted: Orders

## 2021-09-17 NOTE — Progress Notes (Signed)
Virtual Visit via Telephone Note  I connected with  Summer Brown on 09/17/21 at  1:45 PM EDT by telephone and verified that I am speaking with the correct person using two identifiers.  Location: Patient: HOME Provider: [place] Persons participating in the virtual visit: [place]   I discussed the limitations, risks, security and privacy concerns of performing an evaluation and management service by telephone and the availability of in person appointments. The patient expressed understanding and agreed to proceed.  Interactive audio and video telecommunications were attempted between this nurse and patient, however failed, due to patient having technical difficulties OR patient did not have access to video capability.  We continued and completed visit with audio only.  Some vital signs may be absent or patient reported.   Summer David, LPN  Subjective:   Summer Brown is a 66 y.o. female who presents for Medicare Annual (Subsequent) preventive examination.  Review of Systems     Cardiac Risk Factors include: advanced age (>63mn, >>72women)     Objective:    There were no vitals filed for this visit. There is no height or weight on file to calculate BMI.     09/17/2021    1:50 PM 02/19/2020    5:54 PM 09/23/2014    5:44 PM 09/12/2014   10:33 AM  Advanced Directives  Does Patient Have a Medical Advance Directive? No No No No  Would patient like information on creating a medical advance directive? No - Patient declined No - Patient declined Yes - Educational materials given Yes - EScientist, clinical (histocompatibility and immunogenetics)given    Current Medications (verified) Outpatient Encounter Medications as of 09/17/2021  Medication Sig   EPINEPHrine 0.3 mg/0.3 mL IJ SOAJ injection Inject 0.3 mg into the muscle as needed for anaphylaxis.   glipiZIDE (GLUCOTROL XL) 5 MG 24 hr tablet Take 2 tablets (10 mg total) by mouth daily with breakfast.   ibuprofen (ADVIL) 200 MG tablet  Take 200 mg by mouth every 6 (six) hours as needed for moderate pain.   No facility-administered encounter medications on file as of 09/17/2021.    Allergies (verified) Codeine and Iodine   History: Past Medical History:  Diagnosis Date   Angio-edema    Chronic back pain 12/12/2013   Controlled type 2 diabetes mellitus without complication, without long-term current use of insulin ([place] 11/06/2016   Ulcer    Past Surgical History:  Procedure Laterality Date   BACK SURGERY     CERVICAL SPINE SURGERY     FRACTURE SURGERY     NOSE SURGERY     OVARIAN CYST SURGERY     SPINE SURGERY     Family History  Problem Relation Age of Onset   Diabetes Father    Heart attack Mother    Social History   Socioeconomic History   Marital status: Single    Spouse name: Not on file   Number of children: Not on file   Years of education: Not on file   Highest education level: Not on file  Occupational History    Employer: OTHER    Comment: [employer]  Tobacco Use   Smoking status: Never   Smokeless tobacco: Never  Vaping Use   Vaping Use: Never used  Substance and Sexual Activity   Alcohol use: Not Currently   Drug use: No   Sexual activity: Not on file  Other Topics Concern   Not on file  Social History Narrative  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   Social Determinants of Health   Financial Resource Strain: Low Risk  (09/17/2021)   Overall Financial Resource Strain (CARDIA)    Difficulty of Paying Living Expenses: Not hard at all  Food Insecurity: No Food Insecurity (09/17/2021)   Hunger Vital Sign    Worried About Running Out of Food in the Last Year: Never true    Ran Out of Food in the Last Year: Never true  Transportation Needs: No Transportation Needs (09/17/2021)   PRAPARE - Hydrologist (Medical): No    Lack of Transportation (Non-Medical): No  Physical Activity: Insufficiently Active (09/17/2021)   Exercise Vital Sign    Days of  Exercise per Week: 3 days    Minutes of Exercise per Session: 30 min  Stress: No Stress Concern Present (09/17/2021)   [place]    Feeling of Stress : Not at all  Social Connections: Socially Isolated (09/17/2021)   Social Connection and Isolation Panel [NHANES]    Frequency of Communication with Friends and Family: Twice a week    Frequency of Social Gatherings with Friends and Family: Once a week    Attends Religious Services: Never    Marine scientist or Organizations: No    Attends Music therapist: Never    Marital Status: Divorced    Tobacco Counseling Counseling given: Not Answered   Clinical Intake:  Pre-visit preparation completed: Yes  Pain : No/denies pain     Nutritional Risks: None Diabetes: Yes CBG done?: No Did pt. bring in CBG monitor from home?: No  How often do you need to have someone help you when you read instructions, pamphlets, or other written materials from your doctor or pharmacy?: 1 - Never  Diabetic?yes Nutrition Risk Assessment:  Has the patient had any N/V/D within the last 2 months?  No  Does the patient have any non-healing wounds?  No  Has the patient had any unintentional weight loss or weight gain?  No   Diabetes:  Is the patient diabetic?  Yes  If diabetic, was a CBG obtained today?  No  Did the patient bring in their glucometer from home?  No  How often do you monitor your CBG's? Every few days   Financial Strains and Diabetes Management:  Are you having any financial strains with the device, your supplies or your medication? No .  Does the patient want to be seen by Chronic Care Management for management of their diabetes?  No  Would the patient like to be referred to a Nutritionist or for Diabetic Management?  No   Diabetic Exams:  Diabetic Eye Exam: Completed no. Overdue for diabetic eye exam. Pt has been advised about the importance in  completing this exam.   Diabetic Foot Exam: Completed no. Pt has been advised about the importance in completing this exam.   Interpreter Needed?: No  Information entered by :: Kirke Shaggy, LPN   Activities of Daily Living    09/17/2021    1:50 PM  In your present state of health, do you have any difficulty performing the following activities:  Hearing? 0  Vision? 0  Difficulty concentrating or making decisions? 0  Walking or climbing stairs? 0  Dressing or bathing? 0  Doing errands, shopping? 0  Preparing Food and eating ? N  Using the Toilet? N  In the past six months, have you accidently  leaked urine? N  Do you have problems with loss of bowel control? N  Managing your Medications? N  Managing your Finances? N  Housekeeping or managing your Housekeeping? N    Patient Care Team: Owens Loffler, MD as PCP - General (Family Medicine)  Indicate any recent Medical Services you may have received from other than [facility] providers in the past year (date may be approximate).     Assessment:   This is a routine wellness examination for Summer Brown.  Hearing/Vision screen Hearing Screening - Comments:: No aids Vision Screening - Comments:: readers  Dietary issues and exercise activities discussed: Current Exercise Habits: Home exercise routine, Type of exercise: walking, Time (Minutes): 30, Frequency (Times/Week): 3, Weekly Exercise (Minutes/Week): 90, Intensity: Mild   Goals Addressed             This Visit's Progress    DIET - EAT MORE FRUITS AND VEGETABLES         Depression Screen    09/17/2021    1:46 PM 08/28/2020    2:26 PM 09/12/2014   10:33 AM  PHQ 2/9 Scores  PHQ - 2 Score 0 0 0  PHQ- 9 Score 0      Fall Risk    09/17/2021    1:50 PM 08/28/2020    2:25 PM 09/12/2014   10:33 AM  Fall Risk   Falls in the past year? 0 1 No  Number falls in past yr: 0 0   Injury with Fall? 0 1   Risk for fall due to : No Fall Risks    Follow up Falls evaluation  completed      [place]:  Any stairs in or around the home? No  If so, are there any without handrails? No  Home free of loose throw rugs in walkways, pet beds, electrical cords, etc? Yes  Adequate lighting in your home to reduce risk of falls? Yes   ASSISTIVE DEVICES UTILIZED TO PREVENT FALLS:  Life alert? No  Use of a cane, walker or w/c? No  Grab bars in the bathroom? No  Shower chair or bench in shower? No  Elevated toilet seat or a handicapped toilet? No    Cognitive Function:        09/17/2021    1:53 PM  6CIT Screen  What Year? 0 points  What month? 0 points  What time? 0 points  Count back from 20 0 points  Months in reverse 0 points  Repeat phrase 0 points  Total Score 0 points    Immunizations  There is no immunization history on file for this patient.  TDAP status: Due, Education has been provided regarding the importance of this vaccine. Advised may receive this vaccine at local pharmacy or Health Dept. Aware to provide a copy of the vaccination record if obtained from local pharmacy or Health Dept. Verbalized acceptance and understanding.  Flu Vaccine status: Declined, Education has been provided regarding the importance of this vaccine but patient still declined. Advised may receive this vaccine at local pharmacy or Health Dept. Aware to provide a copy of the vaccination record if obtained from local pharmacy or Health Dept. Verbalized acceptance and understanding.  Pneumococcal vaccine status: Declined,  Education has been provided regarding the importance of this vaccine but patient still declined. Advised may receive this vaccine at local pharmacy or Health Dept. Aware to provide a copy of the vaccination record if obtained from local pharmacy or Health Dept. Verbalized  acceptance and understanding.   Covid-19 vaccine status: Declined, Education has been provided regarding the importance of this vaccine but patient still  declined. Advised may receive this vaccine at local pharmacy or Health Dept.or vaccine clinic. Aware to provide a copy of the vaccination record if obtained from local pharmacy or Health Dept. Verbalized acceptance and understanding.  Qualifies for Shingles Vaccine? Yes   Zostavax completed No   Shingrix Completed?: No.    Education has been provided regarding the importance of this vaccine. Patient has been advised to call insurance company to determine out of pocket expense if they have not yet received this vaccine. Advised may also receive vaccine at local pharmacy or Health Dept. Verbalized acceptance and understanding.  Screening Tests Health Maintenance  Topic Date Due   COVID-19 Vaccine (1) Never done   FOOT EXAM  Never done   OPHTHALMOLOGY EXAM  Never done   URINE MICROALBUMIN  Never done   TETANUS/TDAP  Never done   Zoster Vaccines- Shingrix (1 of 2) Never done   COLONOSCOPY (Pts 45-50yr Insurance coverage will need to be confirmed)  Never done   Pneumonia Vaccine 67 Years old (1 - PCV) Never done   DEXA SCAN  Never done   HEMOGLOBIN A1C  07/02/2021   INFLUENZA VACCINE  08/18/2021   MAMMOGRAM  10/09/2021   Hepatitis C Screening  Completed   HPV VACCINES  Aged Out    Health Maintenance  Health Maintenance Due  Topic Date Due   COVID-19 Vaccine (1) Never done   FOOT EXAM  Never done   OPHTHALMOLOGY EXAM  Never done   URINE MICROALBUMIN  Never done   TETANUS/TDAP  Never done   Zoster Vaccines- Shingrix (1 of 2) Never done   COLONOSCOPY (Pts 45-460yrInsurance coverage will need to be confirmed)  Never done   Pneumonia Vaccine 6521Years old (1 - PCV) Never done   DEXA SCAN  Never done   HEMOGLOBIN A1C  07/02/2021   INFLUENZA VACCINE  08/18/2021    Colorectal cancer screening: Referral to GI placed 09/17/21. Pt aware the office will call re: appt.  Mammogram status: Completed 03/05/21. Repeat every year  [facility] referral declined  Lung Cancer Screening: (Low Dose CT  Chest recommended if Age 67-80ears, 30 pack-year currently smoking OR have quit w/in 15years.) does not qualify.    Additional Screening:  Hepatitis C Screening: does qualify; Completed 08/21/20  Vision Screening: Recommended annual ophthalmology exams for early detection of glaucoma and other disorders of the eye. Is the patient up to date with their annual eye exam?  No  Who is the provider or what is the name of the office in which the patient attends annual eye exams? No one If pt is not established with a provider, would they like to be referred to a provider to establish care? No .   Dental Screening: Recommended annual dental exams for proper oral hygiene  Community Resource Referral / Chronic Care Management: CRR required this visit?  No   CCM required this visit?  No      Plan:     I have personally reviewed and noted the following in the patient's chart:   Medical and social history Use of alcohol, tobacco or illicit drugs  Current medications and supplements including opioid prescriptions. Patient is not currently taking opioid prescriptions. Functional ability and status Nutritional status Physical activity Advanced directives List of other physicians Hospitalizations, surgeries, and ER visits in previous 12 months Vitals Screenings  to include cognitive, depression, and falls Referrals and appointments  In addition, I have reviewed and discussed with patient certain preventive protocols, quality metrics, and best practice recommendations. A written personalized care plan for preventive services as well as general preventive health recommendations were provided to patient.     Summer David, LPN   03/19/4994   Nurse Notes: none

## 2021-09-17 NOTE — Patient Instructions (Signed)
Summer Brown , Thank you for taking time to come for your Medicare Wellness Visit. I appreciate your ongoing commitment to your health goals. Please review the following plan we discussed and let me know if I can assist you in the future.   Screening recommendations/referrals: Colonoscopy: referral sent  Mammogram: 03/05/21 Bone Density: referral declined Recommended yearly ophthalmology/optometry visit for glaucoma screening and checkup Recommended yearly dental visit for hygiene and checkup  Vaccinations: Influenza vaccine: n/d Pneumococcal vaccine: n/d Tdap vaccine: n/d Shingles vaccine: n/d   Covid-19:n/d  Advanced directives: no  Conditions/risks identified: no  Next appointment: Follow up in one year for your annual wellness visit 09/21/22 @ 2:15 pm by phone   Preventive Care 20 Years and Older, Female Preventive care refers to lifestyle choices and visits with your health care provider that can promote health and wellness. What does preventive care include? A yearly physical exam. This is also called an annual well check. Dental exams once or twice a year. Routine eye exams. Ask your health care provider how often you should have your eyes checked. Personal lifestyle choices, including: Daily care of your teeth and gums. Regular physical activity. Eating a healthy diet. Avoiding tobacco and drug use. Limiting alcohol use. Practicing safe sex. Taking low-dose aspirin every day. Taking vitamin and mineral supplements as recommended by your health care provider. What happens during an annual well check? The services and screenings done by your health care provider during your annual well check will depend on your age, overall health, lifestyle risk factors, and family history of disease. Counseling  Your health care provider may ask you questions about your: Alcohol use. Tobacco use. Drug use. Emotional well-being. Home and relationship well-being. Sexual  activity. Eating habits. History of falls. Memory and ability to understand (cognition). Work and work Statistician. Reproductive health. Screening  You may have the following tests or measurements: Height, weight, and BMI. Blood pressure. Lipid and cholesterol levels. These may be checked every 5 years, or more frequently if you are over 38 years old. Skin check. Lung cancer screening. You may have this screening every year starting at age 44 if you have a 30-pack-year history of smoking and currently smoke or have quit within the past 15 years. Fecal occult blood test (FOBT) of the stool. You may have this test every year starting at age 83. Flexible sigmoidoscopy or colonoscopy. You may have a sigmoidoscopy every 5 years or a colonoscopy every 10 years starting at age 35. Hepatitis C blood test. Hepatitis B blood test. Sexually transmitted disease (STD) testing. Diabetes screening. This is done by checking your blood sugar (glucose) after you have not eaten for a while (fasting). You may have this done every 1-3 years. Bone density scan. This is done to screen for osteoporosis. You may have this done starting at age 50. Mammogram. This may be done every 1-2 years. Talk to your health care provider about how often you should have regular mammograms. Talk with your health care provider about your test results, treatment options, and if necessary, the need for more tests. Vaccines  Your health care provider may recommend certain vaccines, such as: Influenza vaccine. This is recommended every year. Tetanus, diphtheria, and acellular pertussis (Tdap, Td) vaccine. You may need a Td booster every 10 years. Zoster vaccine. You may need this after age 15. Pneumococcal 13-valent conjugate (PCV13) vaccine. One dose is recommended after age 77. Pneumococcal polysaccharide (PPSV23) vaccine. One dose is recommended after age 72. Talk to your health  care provider about which screenings and vaccines  you need and how often you need them. This information is not intended to replace advice given to you by your health care provider. Make sure you discuss any questions you have with your health care provider. Document Released: 01/31/2015 Document Revised: 09/24/2015 Document Reviewed: 11/05/2014 Elsevier Interactive Patient Education  2017 [employer] Prevention in the Home Falls can cause injuries. They can happen to people of all ages. There are many things you can do to make your home safe and to help prevent falls. What can I do on the outside of my home? Regularly fix the edges of walkways and driveways and fix any cracks. Remove anything that might make you trip as you walk through a door, such as a raised step or threshold. Trim any bushes or trees on the path to your home. Use bright outdoor lighting. Clear any walking paths of anything that might make someone trip, such as rocks or tools. Regularly check to see if handrails are loose or broken. Make sure that both sides of any steps have handrails. Any raised decks and porches should have guardrails on the edges. Have any leaves, snow, or ice cleared regularly. Use sand or salt on walking paths during winter. Clean up any spills in your garage right away. This includes oil or grease spills. What can I do in the bathroom? Use night lights. Install grab bars by the toilet and in the tub and shower. Do not use towel bars as grab bars. Use non-skid mats or decals in the tub or shower. If you need to sit down in the shower, use a plastic, non-slip stool. Keep the floor dry. Clean up any water that spills on the floor as soon as it happens. Remove soap buildup in the tub or shower regularly. Attach bath mats securely with double-sided non-slip rug tape. Do not have throw rugs and other things on the floor that can make you trip. What can I do in the bedroom? Use night lights. Make sure that you have a light by your bed that  is easy to reach. Do not use any sheets or blankets that are too big for your bed. They should not hang down onto the floor. Have a firm chair that has side arms. You can use this for support while you get dressed. Do not have throw rugs and other things on the floor that can make you trip. What can I do in the kitchen? Clean up any spills right away. Avoid walking on wet floors. Keep items that you use a lot in easy-to-reach places. If you need to reach something above you, use a strong step stool that has a grab bar. Keep electrical cords out of the way. Do not use floor polish or wax that makes floors slippery. If you must use wax, use non-skid floor wax. Do not have throw rugs and other things on the floor that can make you trip. What can I do with my stairs? Do not leave any items on the stairs. Make sure that there are handrails on both sides of the stairs and use them. Fix handrails that are broken or loose. Make sure that handrails are as long as the stairways. Check any carpeting to make sure that it is firmly attached to the stairs. Fix any carpet that is loose or worn. Avoid having throw rugs at the top or bottom of the stairs. If you do have throw rugs, attach them to  the floor with carpet tape. Make sure that you have a light switch at the top of the stairs and the bottom of the stairs. If you do not have them, ask someone to add them for you. What else can I do to help prevent falls? Wear shoes that: Do not have high heels. Have rubber bottoms. Are comfortable and fit you well. Are closed at the toe. Do not wear sandals. If you use a stepladder: Make sure that it is fully opened. Do not climb a closed stepladder. Make sure that both sides of the stepladder are locked into place. Ask someone to hold it for you, if possible. Clearly mark and make sure that you can see: Any grab bars or handrails. First and last steps. Where the edge of each step is. Use tools that help you  move around (mobility aids) if they are needed. These include: Canes. Walkers. Scooters. Crutches. Turn on the lights when you go into a dark area. Replace any light bulbs as soon as they burn out. Set up your furniture so you have a clear path. Avoid moving your furniture around. If any of your floors are uneven, fix them. If there are any pets around you, be aware of where they are. Review your medicines with your doctor. Some medicines can make you feel dizzy. This can increase your chance of falling. Ask your doctor what other things that you can do to help prevent falls. This information is not intended to replace advice given to you by your health care provider. Make sure you discuss any questions you have with your health care provider. Document Released: 10/31/2008 Document Revised: 06/12/2015 Document Reviewed: 02/08/2014 Elsevier Interactive Patient Education  2017 [employer].

## 2021-10-02 ENCOUNTER — Ambulatory Visit (INDEPENDENT_AMBULATORY_CARE_PROVIDER_SITE_OTHER)
Admission: RE | Admit: 2021-10-02 | Discharge: 2021-10-02 | Disposition: A | Discharge status: Home / Self Care | Payer: Medicare Other | Source: Ambulatory Visit | Attending: Family | Admitting: Family

## 2021-10-02 ENCOUNTER — Ambulatory Visit (INDEPENDENT_AMBULATORY_CARE_PROVIDER_SITE_OTHER): Payer: Medicare Other | Admitting: Family

## 2021-10-02 ENCOUNTER — Encounter: Payer: Self-pay | Admitting: Family

## 2021-10-02 VITALS — BP 136/82 | HR 88 | Temp 97.9°F | Resp 16 | Ht 66.5 in | Wt 300.0 lb

## 2021-10-02 DIAGNOSIS — S99911A Unspecified injury of right ankle, initial encounter: Secondary | ICD-10-CM | POA: Diagnosis not present

## 2021-10-02 DIAGNOSIS — S99921A Unspecified injury of right foot, initial encounter: Secondary | ICD-10-CM

## 2021-10-02 MED ORDER — IBUPROFEN 600 MG PO TABS: 600.0000 mg | ORAL_TABLET | Freq: Three times a day (TID) | ORAL | 0 refills | Status: DC | PRN

## 2021-10-02 NOTE — Patient Instructions (Signed)
  Ice, elevate, and wrap as you can.  This is necessary for healing.  Complete xray(s) prior to leaving today. I will notify you of your results once received.   Regards,   Eugenia Pancoast FNP-C

## 2021-10-02 NOTE — Progress Notes (Signed)
Established Patient Office Visit  Subjective:  Patient ID: Summer Brown, female    DOB: Sep 30, 1955  Age: 66 y.o. MRN: [id number]  CC:  Chief Complaint  Patient presents with   Foot Injury    X Sunday when she walks on swells yea bad.    HPI Summer Brown is here today with concerns.   Six days ago went to turn and felt something tear in her right side of lateral foot. Felt the pain around the right lateral side, and then felt the pain all over her right foot. Yesterday was pretty swollen, and hard for her to walk. Ok right now, but as she walks on it it causes more pain.   Taking two 200 mg ibuprofen every 6-8 hours.   Past Medical History:  Diagnosis Date   Angio-edema    Chronic back pain 12/12/2013   Controlled type 2 diabetes mellitus without complication, without long-term current use of insulin ([place]) 11/06/2016   Ulcer     Past Surgical History:  Procedure Laterality Date   BACK SURGERY     CERVICAL SPINE SURGERY     FRACTURE SURGERY     NOSE SURGERY     OVARIAN CYST SURGERY     SPINE SURGERY      Family History  Problem Relation Age of Onset   Diabetes Father    Heart attack Mother     Social History   Socioeconomic History   Marital status: Single    Spouse name: Not on file   Number of children: Not on file   Years of education: Not on file   Highest education level: Not on file  Occupational History    Employer: OTHER    Comment: [employer]  Tobacco Use   Smoking status: Never   Smokeless tobacco: Never  Vaping Use   Vaping Use: Never used  Substance and Sexual Activity   Alcohol use: Not Currently   Drug use: No   Sexual activity: Not on file  Other Topics Concern   Not on file  Social History Narrative   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   Social Determinants of Health   Financial Resource Strain: [place]  (09/17/2021)   Overall Financial Resource Strain (CARDIA)    Difficulty of Paying Living Expenses: Not hard at all   Food Insecurity: No Food Insecurity (09/17/2021)   Hunger Vital Sign    Worried About Running Out of Food in the Last Year: Never true    [place] in the Last Year: Never true  Transportation Needs: No Transportation Needs (09/17/2021)   PRAPARE - Hydrologist (Medical): No    Lack of Transportation (Non-Medical): No  Physical Activity: Insufficiently Active (09/17/2021)   Exercise Vital Sign    Days of Exercise per Week: 3 days    Minutes of Exercise per Session: 30 min  Stress: No Stress Concern Present (09/17/2021)   [place]    Feeling of Stress : Not at all  Social Connections: Socially Isolated (09/17/2021)   Social Connection and Isolation Panel [NHANES]    Frequency of Communication with Friends and Family: Twice a week    Frequency of Social Gatherings with Friends and Family: Once a week    Attends Religious Services: Never    Marine scientist or Organizations: No    Attends Archivist Meetings: Never  Marital Status: Divorced  Human resources officer Violence: Not At Risk (09/17/2021)   Humiliation, Afraid, Rape, and Kick questionnaire    Fear of Current or Ex-Partner: No    Emotionally Abused: No    Physically Abused: No    Sexually Abused: No    Outpatient Medications Prior to Visit  Medication Sig Dispense Refill   EPINEPHrine 0.3 mg/0.3 mL IJ SOAJ injection Inject 0.3 mg into the muscle as needed for anaphylaxis. 1 each 3   glipiZIDE (GLUCOTROL XL) 5 MG 24 hr tablet Take 2 tablets (10 mg total) by mouth daily with breakfast. 180 tablet 3   ibuprofen (ADVIL) 200 MG tablet Take 200 mg by mouth every 6 (six) hours as needed for moderate pain.     No facility-administered medications prior to visit.    Allergies  Allergen Reactions   Codeine Itching   Iodine Hives        Objective:    Physical Exam Constitutional:      Appearance: Normal  appearance. She is obese.  Pulmonary:     Effort: Pulmonary effort is normal.  Musculoskeletal:     Right ankle: Swelling (right lower medial ankle effusion with tenderness) present. Tenderness present over the lateral malleolus. Decreased range of motion (painful rom with flexion).     Right Achilles Tendon: No tenderness.     Right foot: Swelling (right upper navicular bone with tenderness) present.  Neurological:     General: No focal deficit present.     Mental Status: She is alert and oriented to person, place, and time. Mental status is at baseline.  Psychiatric:        Mood and Affect: Mood normal.        Behavior: Behavior normal.        Thought Content: Thought content normal.        Judgment: Judgment normal.     BP 136/82   Pulse 88   Temp 97.9 F (36.6 C)   Resp 16   Ht 5' 6.5" (1.689 m)   Wt 300 lb (136.1 kg)   SpO2 97%   BMI 47.70 kg/m  Wt Readings from Last 3 Encounters:  10/02/21 300 lb (136.1 kg)  09/17/21 299 lb (135.6 kg)  08/13/21 299 lb (135.6 kg)     Health Maintenance Due  Topic Date Due   COVID-19 Vaccine (1) Never done   FOOT EXAM  Never done   OPHTHALMOLOGY EXAM  Never done   Diabetic kidney evaluation - Urine ACR  Never done   TETANUS/TDAP  Never done   Zoster Vaccines- Shingrix (1 of 2) Never done   COLONOSCOPY (Pts 45-75yr Insurance coverage will need to be confirmed)  Never done   Pneumonia Vaccine 67 Years old (1 - PCV) Never done   DEXA SCAN  Never done   HEMOGLOBIN A1C  07/02/2021   INFLUENZA VACCINE  Never done    There are no preventive care reminders to display for this patient.  Lab Results  Component Value Date   TSH 2.43 11/01/2016   Lab Results  Component Value Date   WBC 6.8 08/19/2021   HGB 14.9 08/19/2021   HCT 42.0 08/19/2021   MCV 90.7 08/19/2021   PLT 243 08/19/2021   Lab Results  Component Value Date   NA 137 08/19/2021   K 3.6 08/19/2021   CO2 27 08/19/2021   GLUCOSE 110 (H) 08/19/2021   BUN 11  08/19/2021   CREATININE 0.70 08/19/2021   BILITOT 0.9 08/19/2021  ALKPHOS 55 08/19/2021   AST 21 08/19/2021   ALT 22 08/19/2021   PROT 7.1 08/19/2021   ALBUMIN 3.7 08/19/2021   CALCIUM 9.2 08/19/2021   ANIONGAP 5 08/19/2021   GFR 83.16 08/21/2020   Lab Results  Component Value Date   HGBA1C 6.9 (A) 01/01/2021      Assessment & Plan:   Problem List Items Addressed This Visit       Other   Right ankle injury, initial encounter    Long d/w need for ice, elevation, and compression.  Ace bandage for stability. Suspected sprain however will order xray, pending results.       Relevant Medications   ibuprofen (ADVIL) 600 MG tablet   Other Relevant Orders   DG Ankle Complete Right   Right foot injury, initial encounter - Primary    Xray right foot today pending results.  ibuprofen 800 rx sent to pharmacy to take prn       Relevant Medications   ibuprofen (ADVIL) 600 MG tablet   Other Relevant Orders   DG Foot Complete Right    Meds ordered this encounter  Medications   ibuprofen (ADVIL) 600 MG tablet    Sig: Take 1 tablet (600 mg total) by mouth every 8 (eight) hours as needed for moderate pain.    Dispense:  30 tablet    Refill:  0    Order Specific Question:   Supervising Provider    Answer:   BEDSOLE, AMY E [[id number]]    Follow-up: Return if symptoms worsen or fail to improve.    Eugenia Pancoast, FNP

## 2021-10-02 NOTE — Assessment & Plan Note (Signed)
Long d/w need for ice, elevation, and compression.  Ace bandage for stability. Suspected sprain however will order xray, pending results.

## 2021-10-02 NOTE — Assessment & Plan Note (Signed)
Xray right foot today pending results.  ibuprofen 800 rx sent to pharmacy to take prn

## 2021-10-21 NOTE — Progress Notes (Signed)
[facility] did not view their my chart test result that I responded to one week ago.  Please call them and advise them of the below results.

## 2021-10-22 ENCOUNTER — Encounter: Payer: Self-pay | Admitting: Gastroenterology

## 2021-10-26 ENCOUNTER — Ambulatory Visit
Admission: EM | Admit: 2021-10-26 | Discharge: 2021-10-26 | Disposition: A | Discharge status: Home / Self Care | Payer: Medicare Other | Attending: Physician Assistant | Admitting: Physician Assistant

## 2021-10-26 DIAGNOSIS — Z1152 Encounter for screening for COVID-19: Secondary | ICD-10-CM

## 2021-10-26 DIAGNOSIS — J069 Acute upper respiratory infection, unspecified: Secondary | ICD-10-CM

## 2021-10-26 NOTE — ED Triage Notes (Signed)
Pt presents with nasal drainage and sinus congestion for past few days.

## 2021-10-26 NOTE — ED Provider Notes (Signed)
[facility]    CSN: [id number] Arrival date & time: 10/26/21  1031      History   Chief Complaint Chief Complaint  Patient presents with   URI    HPI Summer Brown is a 66 y.o. female.   Patient here today for evaluation of nasal drainage and sinus congestion that started a few days ago. She reports that she has not had fever. She took at home covid test that was negative but her employer requires covid testing at facility. She has not had any nausea, vomiting or diarrhea. She has been taking symptomatic treatment with mild relief of symptoms.   The history is provided by the patient.    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      Home Medications    Prior to Admission medications   Medication Sig Start Date End Date Taking? Authorizing Provider  EPINEPHrine 0.3 mg/0.3 mL IJ SOAJ injection Inject 0.3 mg into the muscle as needed for anaphylaxis. 08/28/20   Copland, Frederico Hamman, MD  glipiZIDE (GLUCOTROL XL) 5 MG 24 hr tablet Take 2 tablets (10 mg total) by mouth daily with  breakfast. 01/01/21   Copland, Frederico Hamman, MD  ibuprofen (ADVIL) 600 MG tablet Take 1 tablet (600 mg total) by mouth every 8 (eight) hours as needed for moderate pain. 10/02/21   Eugenia Pancoast, FNP    Family History Family History  Problem Relation Age of Onset   Diabetes Father    Heart attack Mother     Social History Social History   Tobacco Use   Smoking status: Never   Smokeless tobacco: Never  Vaping Use   Vaping Use: Never used  Substance Use Topics   Alcohol use: Not Currently   Drug use: No     Allergies   Codeine and Iodine   Review of Systems Review of Systems  Constitutional:  Negative for chills and fever.  HENT:  Positive for congestion. Negative for ear pain and sore throat.   Eyes:  Negative for discharge and redness.  Respiratory:  Positive for cough. Negative for shortness of breath and wheezing.   Gastrointestinal:  Negative for abdominal pain, diarrhea, nausea and vomiting.     Physical Exam Triage Vital Signs ED Triage Vitals  Enc Vitals Group     BP      Pulse      Resp      Temp      Temp src      SpO2  Weight      Height      Head Circumference      Peak Flow      Pain Score      Pain Loc      Pain Edu?      Excl. in [place]?    No data found.  Updated Vital Signs BP (!) 149/84 (BP Location: Left Arm)   Pulse 84   Temp 97.9 F (36.6 C) (Oral)   Resp 18   SpO2 92%      Physical Exam Vitals and nursing note reviewed.  Constitutional:      General: She is not in acute distress.    Appearance: Normal appearance. She is not ill-appearing.  HENT:     Head: Normocephalic and atraumatic.     Nose: Congestion present.     Mouth/Throat:     Mouth: Mucous membranes are moist.     Pharynx: No oropharyngeal exudate or posterior oropharyngeal erythema.  Eyes:     Conjunctiva/sclera: Conjunctivae normal.  Cardiovascular:     Rate and Rhythm: Normal rate and regular rhythm.     Heart sounds: Normal heart sounds. No murmur  heard. Pulmonary:     Effort: Pulmonary effort is normal. No respiratory distress.     Breath sounds: Normal breath sounds. No wheezing, rhonchi or rales.  Skin:    General: Skin is warm and dry.  Neurological:     Mental Status: She is alert.  Psychiatric:        Mood and Affect: Mood normal.        Thought Content: Thought content normal.      UC Treatments / Results  Labs (all labs ordered are listed, but only abnormal results are displayed) Labs Reviewed  SARS CORONAVIRUS 2 (TAT 6-24 HRS)    EKG   Radiology No results found.  Procedures Procedures (including critical care time)  Medications Ordered in UC Medications - No data to display  Initial Impression / Assessment and Plan / UC Course  I have reviewed the triage vital signs and the nursing notes.  Pertinent labs & imaging results that were available during my care of the patient were reviewed by me and considered in my medical decision making (see chart for details).    Suspect viral etiology vs allergic rhinitis. Will order covid screening. Recommend she continue symptomatic treatment, otherwise will await results for further recommendation.   Final Clinical Impressions(s) / UC Diagnoses   Final diagnoses:  Acute upper respiratory infection  Encounter for screening for COVID-19   Discharge Instructions   None    ED Prescriptions   None    PDMP not reviewed this encounter.   Francene Finders, PA-C 10/26/21 1302

## 2021-10-27 LAB — SARS CORONAVIRUS 2 (TAT 6-24 HRS): SARS Coronavirus 2: NEGATIVE

## 2021-11-05 ENCOUNTER — Other Ambulatory Visit: Payer: Self-pay

## 2021-11-05 ENCOUNTER — Ambulatory Visit (AMBULATORY_SURGERY_CENTER): Payer: Self-pay

## 2021-11-05 VITALS — Ht 66.5 in | Wt 299.8 lb

## 2021-11-05 DIAGNOSIS — Z1211 Encounter for screening for malignant neoplasm of colon: Secondary | ICD-10-CM

## 2021-11-05 NOTE — Progress Notes (Signed)
Denies allergies to eggs or soy products. Denies complication of anesthesia or sedation. Denies use of weight loss medication. Denies use of O2.   Emmi instructions given for colonoscopy.   Patient states that she can not tolerate any sodium so prep was changed to Miralax using Crystal Light instead of Gatorade.

## 2021-11-18 NOTE — Progress Notes (Unsigned)
    [name], MD, [place] at [facility] [place], [address]  Phone: [phone]  FAX: [phone]  Summer Brown - 66 y.o. female  MRN [id number]  Date of Birth: September 11, 1955  Date: 11/19/2021  PCP: Owens Loffler, MD  Referral: Owens Loffler, MD  No chief complaint on file.  Subjective:   Summer Brown is a 66 y.o. very pleasant female patient with There is no height or weight on file to calculate BMI. who presents with the following:  Very pleasant patient who has a history of diabetes and significant obesity who presents with back pain.  She has had low back pain in the past.  She having pain on the side as well as pain down into her leg.    Review of Systems is noted in the HPI, as appropriate  Objective:   There were no vitals taken for this visit.  GEN: No acute distress; alert,appropriate. PULM: Breathing comfortably in no respiratory distress PSYCH: Normally interactive.   Laboratory and Imaging Data:  Assessment and Plan:   ***

## 2021-11-19 ENCOUNTER — Encounter: Payer: Self-pay | Admitting: Family Medicine

## 2021-11-19 ENCOUNTER — Ambulatory Visit (INDEPENDENT_AMBULATORY_CARE_PROVIDER_SITE_OTHER): Payer: Medicare Other | Admitting: Family Medicine

## 2021-11-19 VITALS — BP 138/86 | HR 85 | Temp 98.3°F | Ht 66.5 in | Wt 296.0 lb

## 2021-11-19 DIAGNOSIS — M5442 Lumbago with sciatica, left side: Secondary | ICD-10-CM | POA: Diagnosis not present

## 2021-11-19 DIAGNOSIS — G5712 Meralgia paresthetica, left lower limb: Secondary | ICD-10-CM | POA: Diagnosis not present

## 2021-11-19 MED ORDER — PREDNISONE 20 MG PO TABS: ORAL_TABLET | ORAL | 0 refills | Status: DC

## 2021-11-24 ENCOUNTER — Other Ambulatory Visit: Payer: Self-pay | Admitting: Family Medicine

## 2021-11-24 DIAGNOSIS — E559 Vitamin D deficiency, unspecified: Secondary | ICD-10-CM

## 2021-11-24 DIAGNOSIS — E1169 Type 2 diabetes mellitus with other specified complication: Secondary | ICD-10-CM

## 2021-11-24 DIAGNOSIS — E785 Hyperlipidemia, unspecified: Secondary | ICD-10-CM

## 2021-11-24 DIAGNOSIS — Z79899 Other long term (current) drug therapy: Secondary | ICD-10-CM

## 2021-11-25 ENCOUNTER — Encounter: Payer: Medicare Other | Admitting: Gastroenterology

## 2021-12-02 ENCOUNTER — Other Ambulatory Visit (INDEPENDENT_AMBULATORY_CARE_PROVIDER_SITE_OTHER): Payer: Medicare Other

## 2021-12-02 DIAGNOSIS — E785 Hyperlipidemia, unspecified: Secondary | ICD-10-CM

## 2021-12-02 DIAGNOSIS — E559 Vitamin D deficiency, unspecified: Secondary | ICD-10-CM | POA: Diagnosis not present

## 2021-12-02 DIAGNOSIS — E1169 Type 2 diabetes mellitus with other specified complication: Secondary | ICD-10-CM | POA: Diagnosis not present

## 2021-12-02 DIAGNOSIS — Z79899 Other long term (current) drug therapy: Secondary | ICD-10-CM | POA: Diagnosis not present

## 2021-12-02 LAB — HEPATIC FUNCTION PANEL
ALT: 14 U/L (ref 0–35)
AST: 12 U/L (ref 0–37)
Albumin: 3.9 g/dL (ref 3.5–5.2)
Alkaline Phosphatase: 56 U/L (ref 39–117)
Bilirubin, Direct: 0.1 mg/dL (ref 0.0–0.3)
Total Bilirubin: 0.6 mg/dL (ref 0.2–1.2)
Total Protein: 6.7 g/dL (ref 6.0–8.3)

## 2021-12-02 LAB — CBC WITH DIFFERENTIAL/PLATELET
Basophils Absolute: 0 10*3/uL (ref 0.0–0.1)
Basophils Relative: 0.6 % (ref 0.0–3.0)
Eosinophils Absolute: 0.2 10*3/uL (ref 0.0–0.7)
Eosinophils Relative: 2.4 % (ref 0.0–5.0)
HCT: 44 % (ref 36.0–46.0)
Hemoglobin: 14.9 g/dL (ref 12.0–15.0)
Lymphocytes Relative: 35.1 % (ref 12.0–46.0)
Lymphs Abs: 2.8 10*3/uL (ref 0.7–4.0)
MCHC: 33.8 g/dL (ref 30.0–36.0)
MCV: 94.7 fl (ref 78.0–100.0)
Monocytes Absolute: 0.7 10*3/uL (ref 0.1–1.0)
Monocytes Relative: 8.4 % (ref 3.0–12.0)
Neutro Abs: 4.3 10*3/uL (ref 1.4–7.7)
Neutrophils Relative %: 53.5 % (ref 43.0–77.0)
Platelets: 253 10*3/uL (ref 150.0–400.0)
RBC: 4.64 Mil/uL (ref 3.87–5.11)
RDW: 13.2 % (ref 11.5–15.5)
WBC: 8.1 10*3/uL (ref 4.0–10.5)

## 2021-12-02 LAB — MICROALBUMIN / CREATININE URINE RATIO
Creatinine,U: 98 mg/dL
Microalb Creat Ratio: 0.7 mg/g (ref 0.0–30.0)
Microalb, Ur: <0.7 mg/dL (ref 0.0–1.9)

## 2021-12-02 LAB — BASIC METABOLIC PANEL
BUN: 18 mg/dL (ref 6–23)
CO2: 33 mEq/L — ABNORMAL HIGH (ref 19–32)
Calcium: 8.9 mg/dL (ref 8.4–10.5)
Chloride: 100 mEq/L (ref 96–112)
Creatinine, Ser: 0.76 mg/dL (ref 0.40–1.20)
GFR: 81.12 mL/min (ref >60.00)
Glucose, Bld: 167 mg/dL — ABNORMAL HIGH (ref 70–99)
Potassium: 4.2 mEq/L (ref 3.5–5.1)
Sodium: 137 mEq/L (ref 135–145)

## 2021-12-02 LAB — LIPID PANEL
Cholesterol: 154 mg/dL (ref 0–200)
HDL: 48.3 mg/dL (ref >39.00)
LDL Cholesterol: 91 mg/dL (ref 0–99)
NonHDL: 105.55
Total CHOL/HDL Ratio: 3
Triglycerides: 75 mg/dL (ref 0.0–149.0)
VLDL: 15 mg/dL (ref 0.0–40.0)

## 2021-12-02 LAB — HEMOGLOBIN A1C: Hgb A1c MFr Bld: 7 % — ABNORMAL HIGH (ref 4.6–6.5)

## 2021-12-02 LAB — VITAMIN D 25 HYDROXY (VIT D DEFICIENCY, FRACTURES): VITD: 22.18 ng/mL — ABNORMAL LOW (ref 30.00–100.00)

## 2021-12-09 ENCOUNTER — Encounter: Payer: Self-pay | Admitting: Family Medicine

## 2021-12-09 ENCOUNTER — Ambulatory Visit (INDEPENDENT_AMBULATORY_CARE_PROVIDER_SITE_OTHER): Payer: Medicare Other | Admitting: Family Medicine

## 2021-12-09 VITALS — BP 120/78 | HR 97 | Temp 98.6°F | Ht 66.5 in | Wt 296.4 lb

## 2021-12-09 DIAGNOSIS — Z1211 Encounter for screening for malignant neoplasm of colon: Secondary | ICD-10-CM

## 2021-12-09 DIAGNOSIS — E119 Type 2 diabetes mellitus without complications: Secondary | ICD-10-CM | POA: Diagnosis not present

## 2021-12-09 NOTE — Progress Notes (Signed)
Michon Kaczmarek T. Krisi Azua, MD, [place] at [facility] [place], [address]  Phone: [phone]  FAX: [phone]  Summer Brown - 66 y.o. female  MRN [id number]  Date of Birth: 01/17/1956  Date: 12/09/2021  PCP: Owens Loffler, MD  Referral: Owens Loffler, MD  Chief Complaint  Patient presents with   Annual Exam    Part 2   Subjective:   ADWOA AXE is a 66 y.o. very pleasant female patient with Body mass index is 47.12 kg/m. who presents with the following:  She presents for medical follow-up after her health maintenance exam with the LPN for Medicare wellness.  Diabetes Mellitus: Tolerating Medications: yes Compliance with diet: fair, Body mass index is 47.12 kg/m. Exercise: minimal / intermittent Avg blood sugars at home: not checking Foot problems: none Hypoglycemia: none No nausea, vomitting, blurred vision, polyuria.  Lab Results  Component Value Date   HGBA1C 7.0 (H) 12/02/2021   HGBA1C 6.9 (A) 01/01/2021   HGBA1C 10.5 (H) 08/21/2020   Lab Results  Component Value Date   MICROALBUR <0.7 12/02/2021   LDLCALC 91 12/02/2021   CREATININE 0.76 12/02/2021    Wt Readings from Last 3 Encounters:  12/09/21 296 lb 6 oz (134.4 kg)  11/19/21 296 lb (134.3 kg)  11/05/21 299 lb 12.8 oz (136 kg)    Has declined all vaccines in the past  Covid - no Foot Eye exam - does not go, reminded Shingrix - does not think she had in the past Colon cancer scr - done Prevnar-20 - no Mammo  Health Maintenance  Topic Date Due   OPHTHALMOLOGY EXAM  Never done   COLONOSCOPY (Pts 45-38yr Insurance coverage will need to be confirmed)  Never done   DEXA SCAN  Never done   MAMMOGRAM  10/09/2021   COVID-19 Vaccine (1) 12/25/2021 (Originally 07/25/1960)   Zoster Vaccines- Shingrix (1 of 2) 03/11/2022 (Originally 07/24/1973)   INFLUENZA VACCINE  04/18/2022 (Originally 08/18/2021)   Pneumonia Vaccine 67 Years old (1  - PCV) 12/10/2022 (Originally 07/25/2019)   HEMOGLOBIN A1C  06/02/2022   Medicare Annual Wellness (AWV)  09/18/2022   Diabetic kidney evaluation - GFR measurement  12/03/2022   Diabetic kidney evaluation - Urine ACR  12/03/2022   FOOT EXAM  12/10/2022   Hepatitis C Screening  Completed   HPV VACCINES  Aged Out     There is no immunization history on file for this patient.   Review of Systems is noted in the HPI, as appropriate  Objective:   BP 120/78   Pulse 97   Temp 98.6 F (37 C) (Oral)   Ht 5' 6.5" (1.689 m)   Wt 296 lb 6 oz (134.4 kg)   SpO2 95%   BMI 47.12 kg/m   GEN: no acute distress. HEENT: Atraumatic, Normocephalic.  Ears and Nose: No external deformity. CV: RRR, No M/G/R. No JVD. No thrill. No extra heart sounds. PULM: CTA B, no wheezes, crackles, rhonchi. No retractions. No resp. distress. No accessory muscle use. ABD: S, NT, ND, +BS. No rebound. No HSM. EXTR: No c/c/e PSYCH: Normally interactive. Conversant.    Laboratory and Imaging Data: Results for orders placed or performed in visit on 12/02/21  VITAMIN D 25 Hydroxy (Vit-D Deficiency, Fractures)  Result Value Ref Range   VITD 22.18 (L) 30.00 - 100.00 ng/mL  Microalbumin / creatinine urine ratio  Result Value Ref Range   Microalb, Ur <0.7 0.0 - 1.9 mg/dL  Creatinine,U 98.0 mg/dL   Microalb Creat Ratio 0.7 0.0 - 30.0 mg/g  Hemoglobin A1c  Result Value Ref Range   Hgb A1c MFr Bld 7.0 (H) 4.6 - 6.5 %  CBC with Differential/Platelet  Result Value Ref Range   WBC 8.1 4.0 - 10.5 K/uL   RBC 4.64 3.87 - 5.11 Mil/uL   Hemoglobin 14.9 12.0 - 15.0 g/dL   HCT 44.0 36.0 - 46.0 %   MCV 94.7 78.0 - 100.0 fl   MCHC 33.8 30.0 - 36.0 g/dL   RDW 13.2 11.5 - 15.5 %   Platelets 253.0 150.0 - 400.0 K/uL   Neutrophils Relative % 53.5 43.0 - 77.0 %   Lymphocytes Relative 35.1 12.0 - 46.0 %   Monocytes Relative 8.4 3.0 - 12.0 %   Eosinophils Relative 2.4 0.0 - 5.0 %   Basophils Relative 0.6 0.0 - 3.0 %   Neutro  Abs 4.3 1.4 - 7.7 K/uL   Lymphs Abs 2.8 0.7 - 4.0 K/uL   Monocytes Absolute 0.7 0.1 - 1.0 K/uL   Eosinophils Absolute 0.2 0.0 - 0.7 K/uL   Basophils Absolute 0.0 0.0 - 0.1 K/uL  Basic metabolic panel  Result Value Ref Range   Sodium 137 135 - 145 mEq/L   Potassium 4.2 3.5 - 5.1 mEq/L   Chloride 100 96 - 112 mEq/L   CO2 33 (H) 19 - 32 mEq/L   Glucose, Bld 167 (H) 70 - 99 mg/dL   BUN 18 6 - 23 mg/dL   Creatinine, Ser 0.76 0.40 - 1.20 mg/dL   GFR 81.12 >60.00 mL/min   Calcium 8.9 8.4 - 10.5 mg/dL  Hepatic function panel  Result Value Ref Range   Total Bilirubin 0.6 0.2 - 1.2 mg/dL   Bilirubin, Direct 0.1 0.0 - 0.3 mg/dL   Alkaline Phosphatase 56 39 - 117 U/L   AST 12 0 - 37 U/L   ALT 14 0 - 35 U/L   Total Protein 6.7 6.0 - 8.3 g/dL   Albumin 3.9 3.5 - 5.2 g/dL  Lipid panel  Result Value Ref Range   Cholesterol 154 0 - 200 mg/dL   Triglycerides 75.0 0.0 - 149.0 mg/dL   HDL 48.30 >39.00 mg/dL   VLDL 15.0 0.0 - 40.0 mg/dL   LDL Cholesterol 91 0 - 99 mg/dL   Total CHOL/HDL Ratio 3    NonHDL 105.55      Assessment and Plan:     ICD-10-CM   1. Screen for colon cancer  Z12.11 Ambulatory referral to Gastroenterology    2. Controlled type 2 diabetes mellitus without complication, without long-term current use of insulin (HCC)  E11.9     3. Morbid obesity ([place])  E66.01      Diabetes stable, under good control with glipizide alone.  She has done a great job and lost 40 pounds, and think that this is enabled her to have good blood sugar and cholesterol control.  She is also active and walking a lot more than normal.  BMI is down to 47.  We reviewed the remainder of her health maintenance, as well.  She has decided that she will hold off on any kind of vaccines.  Patient Instructions   You do not need a referral to make a mammogram appointment, and you may call to make her own mammogram appointment directly around your schedule.  MAMMOGRAPHY IN [place]:  [place] [phone] [place], [place] [address]  [facility] ([facility]) [place] [address] [place], [place] [address] Phone: [phone] Toll Free: [phone]  MAMMOGRAPHY IN [place]:  [place] [facility] or [place]) [phone] Located on the campus of [facility])  [facility]) [place].  [place], [place] [address]    Medication Management during today's office visit: No orders of the defined types were placed in this encounter.  Medications Discontinued During This Encounter  Medication Reason   ibuprofen (ADVIL) 600 MG tablet No longer needed (for PRN medications)   predniSONE (DELTASONE) 20 MG tablet Completed Course   polyethylene glycol (MIRALAX / GLYCOLAX) 17 g packet Completed Course    Orders placed today for conditions managed today: Orders Placed This Encounter  Procedures   Ambulatory referral to Gastroenterology    Disposition: Return in about 6 months (around 06/09/2022) for diabetes.  Dragon [employer] One speech-to-text software was used for transcription in this dictation.  Possible transcriptional errors can occur using Editor, commissioning.   Signed,  Maud Deed. Lacrecia Delval, MD   Outpatient Encounter Medications as of 12/09/2021  Medication Sig   EPINEPHrine 0.3 mg/0.3 mL IJ SOAJ injection Inject 0.3 mg into the muscle as needed for anaphylaxis.   glipiZIDE (GLUCOTROL XL) 5 MG 24 hr tablet Take 2 tablets (10 mg total) by mouth daily with breakfast.   [DISCONTINUED] ibuprofen (ADVIL) 600 MG tablet Take 1 tablet (600 mg total) by mouth every 8 (eight) hours as needed for moderate pain.   [DISCONTINUED] polyethylene glycol (MIRALAX / GLYCOLAX) 17 g packet Take 17 g by mouth daily.   [DISCONTINUED] predniSONE (DELTASONE) 20 MG tablet 2 tabs po daily for 5 days, then 1 tab po daily for 5 days   No facility-administered encounter  medications on file as of 12/09/2021.

## 2021-12-09 NOTE — Patient Instructions (Signed)
  You do not need a referral to make a mammogram appointment, and you may call to make her own mammogram appointment directly around your schedule.  MAMMOGRAPHY IN [place]:  [place] [phone] [place], [place] [address]  [facility] (Former[facility]) [address] [place], [place] [address] Phone: [phone] Toll Free: [phone]  MAMMOGRAPHY IN [place]:  [place] [facility] or [place]) [phone] Located on the campus of [facility])  [facility] Location) [place].  [place], [place] [address]

## 2022-01-28 ENCOUNTER — Ambulatory Visit (AMBULATORY_SURGERY_CENTER): Payer: Medicare Other | Admitting: Gastroenterology

## 2022-01-28 ENCOUNTER — Encounter: Payer: Self-pay | Admitting: Gastroenterology

## 2022-01-28 VITALS — BP 129/75 | HR 76 | Temp 98.7°F | Resp 14 | Ht 66.0 in | Wt 299.0 lb

## 2022-01-28 DIAGNOSIS — D120 Benign neoplasm of cecum: Secondary | ICD-10-CM

## 2022-01-28 DIAGNOSIS — D127 Benign neoplasm of rectosigmoid junction: Secondary | ICD-10-CM

## 2022-01-28 DIAGNOSIS — K635 Polyp of colon: Secondary | ICD-10-CM | POA: Diagnosis not present

## 2022-01-28 DIAGNOSIS — D122 Benign neoplasm of ascending colon: Secondary | ICD-10-CM | POA: Diagnosis not present

## 2022-01-28 DIAGNOSIS — Z1211 Encounter for screening for malignant neoplasm of colon: Secondary | ICD-10-CM

## 2022-01-28 DIAGNOSIS — D123 Benign neoplasm of transverse colon: Secondary | ICD-10-CM | POA: Diagnosis not present

## 2022-01-28 MED ORDER — SODIUM CHLORIDE 0.9 % IV SOLN: 500.0000 mL | Freq: Once | INTRAVENOUS | Status: DC

## 2022-01-28 NOTE — Progress Notes (Signed)
0917 - pt positioned self on to her left side per her tolerance and comfort. Pt has history of left side sciatic pain and history of left shoulder injury. PPP

## 2022-01-28 NOTE — Progress Notes (Signed)
Called to room to assist during endoscopic procedure.  Patient ID and intended procedure confirmed with present staff. Received instructions for my participation in the procedure from the performing physician.  

## 2022-01-28 NOTE — Patient Instructions (Signed)
YOU HAD AN ENDOSCOPIC PROCEDURE TODAY AT THE [facility]:   Refer to the procedure report that was given to you for any specific questions about what was found during the examination.  If the procedure report does not answer your questions, please call your gastroenterologist to clarify.  If you requested that your care partner not be given the details of your procedure findings, then the procedure report has been included in a sealed envelope for you to review at your convenience later.  **Handouts given on polyps and hemorrhoids**  YOU SHOULD EXPECT: Some feelings of bloating in the abdomen. Passage of more gas than usual.  Walking can help get rid of the air that was put into your GI tract during the procedure and reduce the bloating. If you had a lower endoscopy (such as a colonoscopy or flexible sigmoidoscopy) you may notice spotting of blood in your stool or on the toilet paper. If you underwent a bowel prep for your procedure, you may not have a normal bowel movement for a few days.  Please Note:  You might notice some irritation and congestion in your nose or some drainage.  This is from the oxygen used during your procedure.  There is no need for concern and it should clear up in a day or so.  SYMPTOMS TO REPORT IMMEDIATELY:  Following lower endoscopy (colonoscopy or flexible sigmoidoscopy):  Excessive amounts of blood in the stool  Significant tenderness or worsening of abdominal pains  Swelling of the abdomen that is new, acute  Fever of 100F or higher  For urgent or emergent issues, a gastroenterologist can be reached at any hour by calling [phone]. Do not use MyChart messaging for urgent concerns.    DIET:  We do recommend a small meal at first, but then you may proceed to your regular diet.  Drink plenty of fluids but you should avoid alcoholic beverages for 24 hours.  ACTIVITY:  You should plan to take it easy for the rest of today and you should NOT DRIVE or  use heavy machinery until tomorrow (because of the sedation medicines used during the test).    FOLLOW UP: Our staff will call the number listed on your records the next business day following your procedure.  We will call around 7:15- 8:00 am to check on you and address any questions or concerns that you may have regarding the information given to you following your procedure. If we do not reach you, we will leave a message.     If any biopsies were taken you will be contacted by phone or by letter within the next 1-3 weeks.  Please call us at [phone] if you have not heard about the biopsies in 3 weeks.    SIGNATURES/CONFIDENTIALITY: You and/or your care partner have signed paperwork which will be entered into your electronic medical record.  These signatures attest to the fact that that the information above on your After Visit Summary has been reviewed and is understood.  Full responsibility of the confidentiality of this discharge information lies with you and/or your care-partner. 

## 2022-01-28 NOTE — Progress Notes (Signed)
[facility] History and Physical   Primary Care Physician:  Owens Loffler, MD   Reason for Procedure:   Colon cancer screening  Plan:    colonoscopy     HPI: Summer Brown is a 67 y.o. female  here for colonoscopy screening - first time exam. Patient denies any bowel symptoms at this time. No family history of colon cancer known. Otherwise feels well without any cardiopulmonary symptoms.   I have discussed risks / benefits of anesthesia and endoscopic procedure with Rodney Booze and they wish to proceed with the exams as outlined today.    Past Medical History:  Diagnosis Date   Allergy    Angio-edema    Arthritis    Chronic back pain 12/12/2013   Controlled type 2 diabetes mellitus without complication, without long-term current use of insulin ([place]) 11/06/2016   Hypertension    Osteoporosis    Ulcer     Past Surgical History:  Procedure Laterality Date   BACK SURGERY     CERVICAL SPINE SURGERY     FRACTURE SURGERY     NOSE SURGERY     OVARIAN CYST SURGERY     SPINE SURGERY      Prior to Admission medications   Medication Sig Start Date End Date Taking? Authorizing Provider  EPINEPHrine 0.3 mg/0.3 mL IJ SOAJ injection Inject 0.3 mg into the muscle as needed for anaphylaxis. 08/28/20   Copland, Frederico Hamman, MD  glipiZIDE (GLUCOTROL XL) 5 MG 24 hr tablet Take 2 tablets (10 mg total) by mouth daily with breakfast. Patient not taking: Reported on 01/28/2022 01/01/21   Owens Loffler, MD    Current Outpatient Medications  Medication Sig Dispense Refill   EPINEPHrine 0.3 mg/0.3 mL IJ SOAJ injection Inject 0.3 mg into the muscle as needed for anaphylaxis. 1 each 3   glipiZIDE (GLUCOTROL XL) 5 MG 24 hr tablet Take 2 tablets (10 mg total) by mouth daily with breakfast. (Patient not taking: Reported on 01/28/2022) 180 tablet 3   Current Facility-Administered Medications  Medication Dose Route Frequency Provider Last Rate Last Admin   0.9 %  sodium chloride  infusion  500 mL Intravenous Once Kathy Wares, Carlota Raspberry, MD        Allergies as of 01/28/2022 - Review Complete 01/28/2022  Allergen Reaction Noted   Codeine Itching 08/16/2012   Iodine Hives 09/17/2021   Sodium Swelling 11/05/2021    Family History  Problem Relation Age of Onset   Heart attack Mother    Diabetes Father    Colon cancer Neg Hx    Esophageal cancer Neg Hx    Rectal cancer Neg Hx    Stomach cancer Neg Hx     Social History   Socioeconomic History   Marital status: Single    Spouse name: Not on file   Number of children: Not on file   Years of education: Not on file   Highest education level: Not on file  Occupational History    Employer: OTHER    Comment: [employer]  Tobacco Use   Smoking status: Never   Smokeless tobacco: Never  Vaping Use   Vaping Use: Never used  Substance and Sexual Activity   Alcohol use: Not Currently   Drug use: No   Sexual activity: Not on file  Other Topics Concern   Not on file  Social History Narrative   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   Social Determinants of Health   Financial Resource Strain: Low  Risk  (09/17/2021)   Overall Financial Resource Strain (CARDIA)    Difficulty of Paying Living Expenses: Not hard at all  Food Insecurity: No Food Insecurity (09/17/2021)   Hunger Vital Sign    Worried About Running Out of Food in the Last Year: Never true    Ran Out of Food in the Last Year: Never true  Transportation Needs: No Transportation Needs (09/17/2021)   PRAPARE - Hydrologist (Medical): No    Lack of Transportation (Non-Medical): No  Physical Activity: Insufficiently Active (09/17/2021)   Exercise Vital Sign    Days of Exercise per Week: 3 days    Minutes of Exercise per Session: 30 min  Stress: No Stress Concern Present (09/17/2021)   [place]    Feeling of Stress : Not at all  Social Connections: Socially  Isolated (09/17/2021)   Social Connection and Isolation Panel [NHANES]    Frequency of Communication with Friends and Family: Twice a week    Frequency of Social Gatherings with Friends and Family: Once a week    Attends Religious Services: Never    Marine scientist or Organizations: No    Attends Archivist Meetings: Never    Marital Status: Divorced  Human resources officer Violence: Not At Risk (09/17/2021)   Humiliation, Afraid, Rape, and Kick questionnaire    Fear of Current or Ex-Partner: No    Emotionally Abused: No    Physically Abused: No    Sexually Abused: No    Review of Systems: All other review of systems negative except as mentioned in the HPI.  Physical Exam: Vital signs BP (!) 142/86   Pulse 85   Temp 98.7 F (37.1 C)   Resp 14   Ht '5\' 6"'$  (1.676 m)   Wt 299 lb (135.6 kg)   SpO2 100%   BMI 48.26 kg/m   General:   Alert,  Well-developed, pleasant and cooperative in NAD Lungs:  Clear throughout to auscultation.   Heart:  Regular rate and rhythm Abdomen:  Soft, nontender and nondistended.   Neuro/Psych:  Alert and cooperative. Normal mood and affect. A and O x 3  Jolly Mango, MD [facility]

## 2022-01-28 NOTE — Progress Notes (Signed)
Pt's states no medical or surgical changes since previsit or office visit. 

## 2022-01-28 NOTE — Op Note (Signed)
[place] Patient Name: Summer Brown Procedure Date: 01/28/2022 8:57 AM MRN: [id number] Endoscopist: Remo Lipps P. Havery Moros , MD, [id number] Age: 67 Referring MD:  Date of Birth: May 19, 1955 Gender: Female Account #: [id number] Procedure:                Colonoscopy Indications:              Screening for colorectal malignant neoplasm, This                            is the patient's first colonoscopy Medicines:                Monitored Anesthesia Care Procedure:                Pre-Anesthesia Assessment:                           - Prior to the procedure, a History and Physical                            was performed, and patient medications and                            allergies were reviewed. The patient's tolerance of                            previous anesthesia was also reviewed. The risks                            and benefits of the procedure and the sedation                            options and risks were discussed with the patient.                            All questions were answered, and informed consent                            was obtained. Prior Anticoagulants: The patient has                            taken no anticoagulant or antiplatelet agents. ASA                            Grade Assessment: III - A patient with severe                            systemic disease. After reviewing the risks and                            benefits, the patient was deemed in satisfactory                            condition to undergo the procedure.  After obtaining informed consent, the colonoscope                            was passed under direct vision. Throughout the                            procedure, the patient's blood pressure, pulse, and                            oxygen saturations were monitored continuously. The                            Olympus CF-HQ190L ([id number]) Colonoscope was                            introduced through  the anus and advanced to the the                            cecum, identified by appendiceal orifice and                            ileocecal valve. The colonoscopy was performed                            without difficulty. The patient tolerated the                            procedure well. The quality of the bowel                            preparation was adequate. The ileocecal valve,                            appendiceal orifice, and rectum were photographed. Scope In: 9:22:52 AM Scope Out: 9:49:33 AM Scope Withdrawal Time: 0 hours 19 minutes 23 seconds  Total Procedure Duration: 0 hours 26 minutes 41 seconds  Findings:                 The perianal and digital rectal examinations were                            normal.                           A moderate amount of liquid stool was found in the                            entire colon, making visualization difficult.                            Lavage of the colon was performed using copious                            amounts of sterile water, resulting in clearance  with adequate visualization.                           Three sessile polyps were found in the ileocecal                            valve. The polyps were 3 to 10 mm in size. These                            polyps were removed with a cold snare. Resection                            and retrieval were complete.                           Two sessile polyps were found in the ascending                            colon. The polyps were 3 mm in size. These polyps                            were removed with a cold snare. Resection and                            retrieval were complete.                           A 6 mm polyp was found in the hepatic flexure. The                            polyp was sessile. The polyp was removed with a                            cold snare. Resection and retrieval were complete.                           Five flat  polyps were found in the transverse                            colon. The polyps were 3 to 4 mm in size. These                            polyps were removed with a cold snare. Resection                            and retrieval were complete.                           Two sessile polyps were found in the recto-sigmoid                            colon. The polyps were 3 to 5 mm in size. These  polyps were removed with a cold snare. Resection                            and retrieval were complete.                           Internal hemorrhoids were found during                            retroflexion. The hemorrhoids were small.                           The exam was otherwise without abnormality. Complications:            No immediate complications. Estimated blood loss:                            Minimal. Estimated Blood Loss:     Estimated blood loss was minimal. Impression:               - Three 3 to 10 mm polyps at the ileocecal valve,                            removed with a cold snare. Resected and retrieved.                           - Two 3 mm polyps in the ascending colon, removed                            with a cold snare. Resected and retrieved.                           - One 6 mm polyp at the hepatic flexure, removed                            with a cold snare. Resected and retrieved.                           - Five 3 to 4 mm polyps in the transverse colon,                            removed with a cold snare. Resected and retrieved.                           - Two 3 to 5 mm polyps at the recto-sigmoid colon,                            removed with a cold snare. Resected and retrieved.                           - Internal hemorrhoids.                           - The examination was otherwise normal. Recommendation:           -  Patient has a contact number available for                            emergencies. The signs and symptoms of potential                             delayed complications were discussed with the                            patient. Return to normal activities tomorrow.                            Written discharge instructions were provided to the                            patient.                           - Resume previous diet.                           - Continue present medications.                           - Await pathology results. Remo Lipps P. Boss Danielsen, MD 01/28/2022 9:57:19 AM This report has been signed electronically.

## 2022-01-28 NOTE — Progress Notes (Signed)
Pt resting comfortably. VSS. Airway intact. SBAR complete to RN. All questions answered.   

## 2022-01-29 ENCOUNTER — Telehealth: Payer: Self-pay | Admitting: *Deleted

## 2022-01-29 NOTE — Telephone Encounter (Signed)
Post procedure follow up phone call. No answer at number given.  Left message on voicemail.  

## 2022-02-15 ENCOUNTER — Telehealth: Payer: Self-pay | Admitting: Gastroenterology

## 2022-02-15 NOTE — Telephone Encounter (Signed)
Returned call to patient. We reviewed pathology results. Pt had concerns regarding epigastric pain that she has been having. Pt states that she had similar symptoms when she was a teenager and went to ED for evaluation, they determined that she had several stomach ulcers. I advised pt that she will need an OV to discuss concerns. I offered patient an appt this week, but she needs a morning appt. Pt has been scheduled for a follow up appt with Dr. Havery Moros on Wednesday, 03/17/22 at 11 am. Pt asked that I mail her appt information since she does not have access to [place]. Pt had no other concerns at the end of the call.

## 2022-02-15 NOTE — Telephone Encounter (Signed)
Patient called to speak with someone regarding the path results, stated she did not get Dr. Doyne Keel message.

## 2022-02-15 NOTE — Telephone Encounter (Signed)
Dear Ms. Enis,   11 of the 13 polyps that I removed from your colon were proven to be adenomatous and sessile serrated.  These are considered to be pre-cancerous polyps that may have grown into cancers if they had not been removed.  Based on current nationally recognized surveillance guidelines, I recommend that you have your next colonoscopy in 1 year.   If you develop any new rectal bleeding, abdominal pain or significant bowel habit changes, please contact me before then.   If you have any questions about this recommendation otherwise, please do not hesitate to contact me.    Cellar, MD [facility]

## 2022-02-16 ENCOUNTER — Ambulatory Visit: Payer: Medicare Other | Admitting: Allergy & Immunology

## 2022-03-17 ENCOUNTER — Encounter: Payer: Self-pay | Admitting: Gastroenterology

## 2022-03-17 ENCOUNTER — Ambulatory Visit (INDEPENDENT_AMBULATORY_CARE_PROVIDER_SITE_OTHER): Payer: Medicare Other | Admitting: Gastroenterology

## 2022-03-17 VITALS — BP 130/84 | HR 98 | Ht 66.25 in | Wt 295.0 lb

## 2022-03-17 DIAGNOSIS — R1031 Right lower quadrant pain: Secondary | ICD-10-CM

## 2022-03-17 DIAGNOSIS — Z86010 Personal history of colonic polyps: Secondary | ICD-10-CM

## 2022-03-17 DIAGNOSIS — R933 Abnormal findings on diagnostic imaging of other parts of digestive tract: Secondary | ICD-10-CM

## 2022-03-17 NOTE — Progress Notes (Signed)
HPI :  67 y/o female here for a follow up visit.  I performed her colonoscopy this past January, which was her first exam that she ever had.  She had 13 polyps removed, largest was 1 cm in size, 11 of them were sessile serrated and adenomatous.  She denies any family history of colon cancer.  No problems with her bowels.  We had followed up with her with the results of her colonoscopy exam, she had endorsed some abdominal pain and we booked her a follow-up visit to discuss this.  She states at baseline she does not have any abdominal pains that currently bother her.  She was referring to an episode in August 2023 where she had right sided abdominal pain that led to an ED visit.  She states she had pain for a few days that was rather severe.  She had an ultrasound which showed no gallstones but question the presence of cirrhosis.  Her spleen was normal on exam.  She subsequently had a CT scan with contrast that was normal, no cause for her pain, her liver appeared normal, no evidence of cirrhosis or splenomegaly.  Of note she had a allergic reaction to contrast.  She denies any history of liver disease, no alcohol use.  Her liver enzymes and platelet counts have been normal.  She states her abdominal pain resolved without intervention and has not recurred.  She denies any problems eating.  She has been working on weight loss and has lost a significant weight over the past year or so (40 pounds), she states she is working on this on her own.  Otherwise she feels well without complaints today.  Colonoscopy 01/28/22: The perianal and digital rectal examinations were                            normal.                           A moderate amount of liquid stool was found in the                            entire colon, making visualization difficult.                            Lavage of the colon was performed using copious                            amounts of sterile water, resulting in clearance                             with adequate visualization.                           Three sessile polyps were found in the ileocecal                            valve. The polyps were 3 to 10 mm in size. These                            polyps were removed with a cold snare. Resection  and retrieval were complete.                           Two sessile polyps were found in the ascending                            colon. The polyps were 3 mm in size. These polyps                            were removed with a cold snare. Resection and                            retrieval were complete.                           A 6 mm polyp was found in the hepatic flexure. The                            polyp was sessile. The polyp was removed with a                            cold snare. Resection and retrieval were complete.                           Five flat polyps were found in the transverse                            colon. The polyps were 3 to 4 mm in size. These                            polyps were removed with a cold snare. Resection                            and retrieval were complete.                           Two sessile polyps were found in the recto-sigmoid                            colon. The polyps were 3 to 5 mm in size. These                            polyps were removed with a cold snare. Resection                            and retrieval were complete.                           Internal hemorrhoids were found during                            retroflexion. The hemorrhoids were small.  The exam was otherwise without abnormality.  11 of the 13 polyps that I removed from your colon were proven to be adenomatous and sessile serrated.    [place] 08/19/2021: IMPRESSION: 1. No evidence of cholelithiasis or acute cholecystitis. 2. Nodular contour to the liver may indicate cirrhosis. Benign-appearing cyst. No additional imaging follow-up is  indicated for this lesion. 3. Examination is otherwise unremarkable.  CT 08/19/21: IMPRESSION: No evidence of acute abnormality in the abdomen or pelvis. Liver and spleen normal    Past Medical History:  Diagnosis Date   Allergy    Angio-edema    Arthritis    Chronic back pain 12/12/2013   Controlled type 2 diabetes mellitus without complication, without long-term current use of insulin ([place]) 11/06/2016   Hypertension    Osteoporosis    Ulcer      Past Surgical History:  Procedure Laterality Date   BACK SURGERY     CERVICAL SPINE SURGERY     FRACTURE SURGERY     NOSE SURGERY     OVARIAN CYST SURGERY     SPINE SURGERY     Family History  Problem Relation Age of Onset   Heart attack Mother    Diabetes Father    Colon cancer Neg Hx    Esophageal cancer Neg Hx    Rectal cancer Neg Hx    Stomach cancer Neg Hx    Social History   Tobacco Use   Smoking status: Never   Smokeless tobacco: Never  Vaping Use   Vaping Use: Never used  Substance Use Topics   Alcohol use: Not Currently   Drug use: No   Current Outpatient Medications  Medication Sig Dispense Refill   EPINEPHrine 0.3 mg/0.3 mL IJ SOAJ injection Inject 0.3 mg into the muscle as needed for anaphylaxis. 1 each 3   No current facility-administered medications for this visit.   Allergies  Allergen Reactions   Codeine Itching   Iodine Hives   Sodium Swelling     Review of Systems: All systems reviewed and negative except where noted in HPI.   Lab Results  Component Value Date   WBC 8.1 12/02/2021   HGB 14.9 12/02/2021   HCT 44.0 12/02/2021   MCV 94.7 12/02/2021   PLT 253.0 12/02/2021    Lab Results  Component Value Date   CREATININE 0.76 12/02/2021   BUN 18 12/02/2021   NA 137 12/02/2021   K 4.2 12/02/2021   CL 100 12/02/2021   CO2 33 (H) 12/02/2021    Lab Results  Component Value Date   ALT 14 12/02/2021   AST 12 12/02/2021   ALKPHOS 56 12/02/2021   BILITOT 0.6 12/02/2021      Physical Exam: BP 130/84   Pulse 98   Ht 5' 6.25" (1.683 m)   Wt 295 lb (133.8 kg)   BMI 47.26 kg/m  Constitutional: Pleasant,well-developed, female in no acute distress. Abdominal: Soft, nondistended, nontender. There are no masses palpable.  Neurological: Alert and oriented to person place and time. Psychiatric: Normal mood and affect. Behavior is normal.   ASSESSMENT: 67 y.o. female here for assessment of the following  1. History of colon polyps   2. Right lower quadrant abdominal pain   3. Abnormal finding on GI tract imaging    11 adenomas/sessile serrated polyps on first colonoscopy, will 1 of which was 1 cm in size.  Based on polyp burden and findings recommend a repeat colonoscopy in 1 year.  We discussed whether or not she  wanted to pursue genetic testing for this.  She has no family history of colon cancer.  Given this was her first exam, started much later in life for her exams, she wishes to hold off on genetic testing for now and await the results of her next exam in January 2025 which I think is reasonable.  Otherwise she currently has no abdominal pain.  Unclear what caused her prior symptoms, imaging did not show any concerning pathology.  We reviewed her studies, the ultrasound question to the presence of cirrhosis of the liver, but CT showed no evidence of this and her spleen is normal as well as her liver enzymes and platelet count.  I do not think that she clinically has cirrhosis but she should have her blood work checked yearly to make sure stable.  She is otherwise working on weight loss   PLAN: - repeat colonoscopy Jan 2025 - holding off on genetic testing right now - abdominal pain resolved, contact me if this recurs - do not think she has cirrhosis based on CT and labs - she will continue to work on weight loss  Jolly Mango, MD [facility]

## 2022-03-17 NOTE — Patient Instructions (Signed)
If your blood pressure at your visit was 140/90 or greater, please contact your primary care physician to follow up on this.  _______________________________________________________  If you are age 69 or older, your body mass index should be between 23-30. Your Body mass index is 47.26 kg/m. If this is out of the aforementioned range listed, please consider follow up with your Primary Care Provider.  If you are age 51 or younger, your body mass index should be between 19-25. Your Body mass index is 47.26 kg/m. If this is out of the aformentioned range listed, please consider follow up with your Primary Care Provider.   ________________________________________________________  The [facility] providers would like to encourage you to use [facility] to communicate with providers for non-urgent requests or questions.  Due to long hold times on the telephone, sending your provider a message by [facility] may be a faster and more efficient way to get a response.  Please allow 48 business hours for a response.  Please remember that this is for non-urgent requests.  _______________________________________________________  Due to recent changes in healthcare laws, you may see the results of your imaging and laboratory studies on MyChart before your provider has had a chance to review them.  We understand that in some cases there may be results that are confusing or concerning to you. Not all laboratory results come back in the same time frame and the provider may be waiting for multiple results in order to interpret others.  Please give [place] 48 hours in order for your provider to thoroughly review all the results before contacting the office for clarification of your results.   You will be due for a recall colonoscopy in 01-2023. We will send you a reminder in the mail when it gets closer to that time.  Thank you for entrusting me with your care and for choosing [facility], Dr. Titonka Cellar

## 2022-06-08 NOTE — Progress Notes (Deleted)
    Summer Baswell T. [name], MD, CAQ Sports Medicine [facility] at [facility] [address] [place], [address]  Phone: [phone]  FAX: [phone]  Summer Brown - 67 y.o. female  MRN [id number]  Date of Birth: 07/28/1955  Date: 06/09/2022  PCP: Hannah Beat, MD  Referral: Hannah Beat, MD  No chief complaint on file.  Subjective:   Summer Brown is a 67 y.o. very pleasant female patient with There is no height or weight on file to calculate BMI. who presents with the following:  Patient presents for follow-up for diabetes.  Previously, she had been poorly controlled with the A1c greater than 10.  She is currently managing this with diet alone, and she has done quite well with this, remarkably so.  Her last A1c was 7, off of medication.  Lab Results  Component Value Date   HGBA1C 7.0 (H) 12/02/2021     Review of Systems is noted in the HPI, as appropriate  Objective:   There were no vitals taken for this visit.  GEN: No acute distress; alert,appropriate. PULM: Breathing comfortably in no respiratory distress PSYCH: Normally interactive.   Laboratory and Imaging Data:  Assessment and Plan:   ***

## 2022-06-09 ENCOUNTER — Ambulatory Visit: Payer: Medicare Other | Admitting: Family Medicine

## 2022-06-09 DIAGNOSIS — E119 Type 2 diabetes mellitus without complications: Secondary | ICD-10-CM

## 2022-09-02 ENCOUNTER — Encounter (INDEPENDENT_AMBULATORY_CARE_PROVIDER_SITE_OTHER): Payer: Self-pay

## 2022-09-21 ENCOUNTER — Ambulatory Visit (INDEPENDENT_AMBULATORY_CARE_PROVIDER_SITE_OTHER): Payer: Medicare Other

## 2022-09-21 VITALS — Ht 66.5 in | Wt 268.0 lb

## 2022-09-21 DIAGNOSIS — Z0000 Encounter for general adult medical examination without abnormal findings: Secondary | ICD-10-CM | POA: Diagnosis not present

## 2022-09-21 DIAGNOSIS — Z1231 Encounter for screening mammogram for malignant neoplasm of breast: Secondary | ICD-10-CM

## 2022-09-21 NOTE — Progress Notes (Addendum)
Subjective:   Summer Brown is a 67 y.o. female who presents for Medicare Annual (Subsequent) preventive examination.  Visit Complete: Virtual  I connected with  Courtney Heys on 09/21/22 by a audio enabled telemedicine application and verified that I am speaking with the correct person using two identifiers.  Patient Location: Home  Provider Location: Home Office  I discussed the limitations of evaluation and management by telemedicine. The patient expressed understanding and agreed to proceed.  Patient filled out questionnaire on 08/20/22. I have reviewed and verified with the patient that all her answers are correct.   Vital Signs: Because this visit was a virtual/telehealth visit, some criteria may be missing or patient reported. Any vitals not documented were not able to be obtained and vitals that have been documented are patient reported.    Review of Systems      Cardiac Risk Factors include: obesity (BMI >30kg/m2);sedentary lifestyle;advanced age (>78men, >60 women)     Objective:    Today's Vitals   09/20/22 1426 09/21/22 1357  Weight:  268 lb (121.6 kg)  Height:  5' 6.5" (1.689 m)  PainSc: 4     Body mass index is 42.61 kg/m.     09/21/2022    2:07 PM 09/17/2021    1:50 PM 02/19/2020    5:54 PM 09/23/2014    5:44 PM 09/12/2014   10:33 AM  Advanced Directives  Does Patient Have a Medical Advance Directive? No No No No No  Would patient like information on creating a medical advance directive? No - Patient declined No - Patient declined No - Patient declined Yes - Educational materials given Yes - Educational materials given    Current Medications (verified) Outpatient Encounter Medications as of 09/21/2022  Medication Sig   EPINEPHrine 0.3 mg/0.3 mL IJ SOAJ injection Inject 0.3 mg into the muscle as needed for anaphylaxis.   No facility-administered encounter medications on file as of 09/21/2022.    Allergies (verified) Codeine, Iodine, and Sodium    History: Past Medical History:  Diagnosis Date   Allergy    Angio-edema    Arthritis    Chronic back pain 12/12/2013   Controlled type 2 diabetes mellitus without complication, without long-term current use of insulin (HCC) 11/06/2016   Hypertension    Osteoporosis    Ulcer    Past Surgical History:  Procedure Laterality Date   BACK SURGERY     CERVICAL SPINE SURGERY     FRACTURE SURGERY     NOSE SURGERY     OVARIAN CYST SURGERY     SPINE SURGERY     Family History  Problem Relation Age of Onset   Heart attack Mother    Diabetes Father    Colon cancer Neg Hx    Esophageal cancer Neg Hx    Rectal cancer Neg Hx    Stomach cancer Neg Hx    Social History   Socioeconomic History   Marital status: Single    Spouse name: Not on file   Number of children: Not on file   Years of education: Not on file   Highest education level: Not on file  Occupational History    Employer: OTHER    Comment: [employer]  Tobacco Use   Smoking status: Never   Smokeless tobacco: Never  Vaping Use   Vaping status: Never Used  Substance and Sexual Activity   Alcohol use: Not Currently   Drug use: No   Sexual activity: Not on file  Other Topics Concern   Not on file  Social History Narrative   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   Social Determinants of Health   Financial Resource Strain: Low Risk  (09/20/2022)   Overall Financial Resource Strain (CARDIA)    Difficulty of Paying Living Expenses: Not very hard  Food Insecurity: No Food Insecurity (09/21/2022)   Hunger Vital Sign    Worried About Running Out of Food in the Last Year: Never true    Ran Out of Food in the Last Year: Never true  Recent Concern: Food Insecurity - Food Insecurity Present (09/20/2022)   Hunger Vital Sign    Worried About Running Out of Food in the Last Year: Sometimes true    Ran Out of Food in the Last Year: Sometimes true  Transportation Needs: No Transportation Needs (09/20/2022)   PRAPARE -  Administrator, Civil Service (Medical): No    Lack of Transportation (Non-Medical): No  Physical Activity: Inactive (09/20/2022)   Exercise Vital Sign    Days of Exercise per Week: 0 days    Minutes of Exercise per Session: 0 min  Stress: No Stress Concern Present (09/20/2022)   [employer] of Occupational Health - Occupational Stress Questionnaire    Feeling of Stress : Not at all  Social Connections: Moderately Isolated (09/20/2022)   Social Connection and Isolation Panel [NHANES]    Frequency of Communication with Friends and Family: More than three times a week    Frequency of Social Gatherings with Friends and Family: Twice a week    Attends Religious Services: More than 4 times per year    Active Member of [employer] or Organizations: No    Attends Engineer, structural: Never    Marital Status: Divorced    Tobacco Counseling Counseling given: Not Answered   Clinical Intake:  Pre-visit preparation completed: Yes  Pain : 0-10 Pain Score: 4  Pain Location: Back Pain Orientation: Medial Pain Descriptors / Indicators: Aching Pain Onset: More than a month ago     BMI - recorded: 42.61 Nutritional Status: BMI > 30  Obese Nutritional Risks: None Diabetes: No  How often do you need to have someone help you when you read instructions, pamphlets, or other written materials from your doctor or pharmacy?: 1 - Never  Interpreter Needed?: No  Information entered by :: C.Alahia Whicker LPN   Activities of Daily Living    09/20/2022    2:26 PM  In your present state of health, do you have any difficulty performing the following activities:  Hearing? 0  Vision? 0  Difficulty concentrating or making decisions? 0  Walking or climbing stairs? 1  Comment Hip pain  Dressing or bathing? 0  Doing errands, shopping? 0  Preparing Food and eating ? N  Using the Toilet? N  In the past six months, have you accidently leaked urine? Y  Comment Wears pad  Do you have  problems with loss of bowel control? N  Managing your Medications? N  Managing your Finances? N  Housekeeping or managing your Housekeeping? N    Patient Care Team: Hannah Beat, MD as PCP - General (Family Medicine)  Indicate any recent Medical Services you may have received from other than [facility] providers in the past year (date may be approximate).     Assessment:   This is a routine wellness examination for Kirandeep.  Hearing/Vision screen Hearing Screening - Comments:: Denies hearing difficulties   Vision Screening - Comments:: No  vision issues - Does not see eye doctor  Dietary issues and exercise activities discussed:     Goals Addressed             This Visit's Progress    Patient Stated       Continue to make smart food choices.       Depression Screen    09/21/2022    2:03 PM 09/17/2021    1:46 PM 08/28/2020    2:26 PM 09/12/2014   10:33 AM  PHQ 2/9 Scores  PHQ - 2 Score 0 0 0 0  PHQ- 9 Score  0      Fall Risk    09/20/2022    2:26 PM 09/17/2021    1:50 PM 08/28/2020    2:25 PM 09/12/2014   10:33 AM  Fall Risk   Falls in the past year? 0 0 1 No  Number falls in past yr: 0 0 0   Injury with Fall? 0 0 1   Risk for fall due to : No Fall Risks No Fall Risks    Follow up Falls prevention discussed;Falls evaluation completed Falls evaluation completed      MEDICARE RISK AT HOME: Medicare Risk at Home Any stairs in or around the home?: Yes If so, are there any without handrails?: Yes Home free of loose throw rugs in walkways, pet beds, electrical cords, etc?: Yes Adequate lighting in your home to reduce risk of falls?: Yes Life alert?: No Use of a cane, walker or w/c?: No Grab bars in the bathroom?: No Shower chair or bench in shower?: No Elevated toilet seat or a handicapped toilet?: Yes  TIMED UP AND GO:  Was the test performed?  No    Cognitive Function:        09/21/2022    2:11 PM 09/17/2021    1:53 PM  6CIT Screen  What Year? 0 points  0 points  What month? 0 points 0 points  What time? 3 points 0 points  Count back from 20 0 points 0 points  Months in reverse 2 points 0 points  Repeat phrase 6 points 0 points  Total Score 11 points 0 points    Immunizations  There is no immunization history on file for this patient.  TDAP status: Due, Education has been provided regarding the importance of this vaccine. Advised may receive this vaccine at local pharmacy or Health Dept. Aware to provide a copy of the vaccination record if obtained from local pharmacy or Health Dept. Verbalized acceptance and understanding.  Flu Vaccine status: Due, Education has been provided regarding the importance of this vaccine. Advised may receive this vaccine at local pharmacy or Health Dept. Aware to provide a copy of the vaccination record if obtained from local pharmacy or Health Dept. Verbalized acceptance and understanding.  Pneumococcal vaccine status: Due, Education has been provided regarding the importance of this vaccine. Advised may receive this vaccine at local pharmacy or Health Dept. Aware to provide a copy of the vaccination record if obtained from local pharmacy or Health Dept. Verbalized acceptance and understanding.  Covid-19 vaccine status: Declined, Education has been provided regarding the importance of this vaccine but patient still declined. Advised may receive this vaccine at local pharmacy or Health Dept.or vaccine clinic. Aware to provide a copy of the vaccination record if obtained from local pharmacy or Health Dept. Verbalized acceptance and understanding.  Qualifies for Shingles Vaccine? Yes   Zostavax completed No   Shingrix Completed?: No.  Education has been provided regarding the importance of this vaccine. Patient has been advised to call insurance company to determine out of pocket expense if they have not yet received this vaccine. Advised may also receive vaccine at local pharmacy or Health Dept. Verbalized  acceptance and understanding.  Screening Tests Health Maintenance  Topic Date Due   COVID-19 Vaccine (1) Never done   OPHTHALMOLOGY EXAM  Never done   DTaP/Tdap/Td (1 - Tdap) Never done   Zoster Vaccines- Shingrix (1 of 2) Never done   DEXA SCAN  Never done   MAMMOGRAM  10/09/2021   HEMOGLOBIN A1C  06/02/2022   INFLUENZA VACCINE  Never done   Pneumonia Vaccine 22+ Years old (1 of 1 - PCV) 12/10/2022 (Originally 07/25/2019)   Diabetic kidney evaluation - eGFR measurement  12/03/2022   Diabetic kidney evaluation - Urine ACR  12/03/2022   FOOT EXAM  12/10/2022   Colonoscopy  01/29/2023   Medicare Annual Wellness (AWV)  09/21/2023   Hepatitis C Screening  Completed   HPV VACCINES  Aged Out    Health Maintenance  Health Maintenance Due  Topic Date Due   COVID-19 Vaccine (1) Never done   OPHTHALMOLOGY EXAM  Never done   DTaP/Tdap/Td (1 - Tdap) Never done   Zoster Vaccines- Shingrix (1 of 2) Never done   DEXA SCAN  Never done   MAMMOGRAM  10/09/2021   HEMOGLOBIN A1C  06/02/2022   INFLUENZA VACCINE  Never done    Colorectal cancer screening: Type of screening: Colonoscopy. Completed 01/0. Repeat every unknown years  Mammogram status: Ordered /. Pt provided with contact info and advised to call to schedule appt.   Bone Density- Will discuss with PCP  Lung Cancer Screening: (Low Dose CT Chest recommended if Age 72-80 years, 20 pack-year currently smoking OR have quit w/in 15years.) does not qualify.   Lung Cancer Screening Referral:    Additional Screening:  Hepatitis C S//creening: does not qualify; Completed 08/21/20  Vision Screening: Recommended annual ophthalmology exams for early detection of glaucoma and other disorders of the eye. Is the patient up to date with their annual eye exam?  No  Who is the provider or what is the name of the office in which the patient attends annual eye exams? Declined If pt is not established with a provider, would they like to be  referred to a provider to establish care?  Declined .   Dental Screening: Recommended annual dental exams for proper oral hygiene  Diabetic Foot Exam:   Community Resource Referral / Chronic Care Management: CRR required this visit?  No   CCM required this visit?  No     Plan:     I have personally reviewed and noted the following in the patient's chart:   Medical and social history Use of alcohol, tobacco or illicit drugs  Current medications and supplements including opioid prescriptions. Patient is not currently taking opioid prescriptions. Functional ability and status Nutritional status Physical activity Advanced directives List of other physicians Hospitalizations, surgeries, and ER visits in previous 12 months Vitals Screenings to include cognitive, depression, and falls Referrals and appointments  In addition, I have reviewed and discussed with patient certain preventive protocols, quality metrics, and best practice recommendations. A written personalized care plan for preventive services as well as general preventive health recommendations were provided to patient.     Maryan Puls, LPN   08/22/1322   After Visit Summary: (MyChart) Due to this being a telephonic visit, the  after visit summary with patients personalized plan was offered to patient via MyChart   Nurse Notes: pt not very cooperative and argumentative answering questions even after explaining the purpose of the questions.  Vaccinations: declines all Influenza vaccine: recommend every Fall Pneumococcal vaccine: recommend once per lifetime Prevnar-20 Tdap vaccine: recommend every 10 years Shingles vaccine: recommend Shingrix which is 2 doses 2-6 months apart and over 90% effective     Covid-19: recommend 2 doses one month apart with a booster 6 months later

## 2022-09-21 NOTE — Patient Instructions (Addendum)
Ms. Modlin , Thank you for taking time to come for your Medicare Wellness Visit. I appreciate your ongoing commitment to your health goals. Please review the following plan we discussed and let me know if I can assist you in the future.   Referrals/Orders/Follow-Ups/Clinician Recommendations: Aim for 30 minutes of exercise or brisk walking, 6-8 glasses of water, and 5 servings of fruits and vegetables each day.   You have an order for:  []   2D Mammogram  [x]   3D Mammogram  []   Bone Density     Please call for appointment:  [facility] [address] [place], [place] [address] [phone]  [facility] [address] [place], [place] [address] [phone]  [facility] [address] [place], [place] [address] [phone]  [facility] [place] [facility] [address] [place], [place] [address] [phone]  [facility] [address] [place], [place] [address] [phone]    Make sure to wear two-piece clothing.  No lotions, powders, or deodorants the day of the appointment. Make sure to bring picture ID and insurance card.  Bring list of medications you are currently taking including any supplements.   Schedule your [place] screening mammogram through MyChart!   Log into your MyChart account.  Go to 'Visit' (or 'Appointments' if on mobile App) --> Schedule an Appointment  Under 'Select a Reason for Visit' choose the Mammogram Screening option.  Complete the pre-visit questions and select the time and place that best fits your schedule.    This is a list of the screening recommended for you and due dates:  Health Maintenance  Topic Date Due   COVID-19 Vaccine (1) Never done   Eye exam for diabetics  Never done   DTaP/Tdap/Td vaccine (1 - Tdap) Never done   Zoster (Shingles) Vaccine (1 of 2) Never done   DEXA scan (bone density measurement)   Never done   Mammogram  10/09/2021   Hemoglobin A1C  06/02/2022   Flu Shot  Never done   Medicare Annual Wellness Visit  09/18/2022   Pneumonia Vaccine (1 of 1 - PCV) 12/10/2022*   Yearly kidney function blood test for diabetes  12/03/2022   Yearly kidney health urinalysis for diabetes  12/03/2022   Complete foot exam   12/10/2022   Colon Cancer Screening  01/29/2023   Hepatitis C Screening  Completed   HPV Vaccine  Aged Out  *Topic was postponed. The date shown is not the original due date.    Advanced directives: (Declined) Advance directive discussed with you today. Even though you declined this today, please call our office should you change your mind, and we can give you the proper paperwork for you to fill out.  Next Medicare Annual Wellness Visit scheduled for next year: Yes

## 2022-10-19 ENCOUNTER — Other Ambulatory Visit: Payer: Self-pay | Admitting: Family Medicine

## 2022-12-09 ENCOUNTER — Telehealth: Payer: Self-pay | Admitting: *Deleted

## 2022-12-09 DIAGNOSIS — Z79899 Other long term (current) drug therapy: Secondary | ICD-10-CM

## 2022-12-09 DIAGNOSIS — E785 Hyperlipidemia, unspecified: Secondary | ICD-10-CM

## 2022-12-09 DIAGNOSIS — E559 Vitamin D deficiency, unspecified: Secondary | ICD-10-CM

## 2022-12-09 DIAGNOSIS — E119 Type 2 diabetes mellitus without complications: Secondary | ICD-10-CM

## 2022-12-09 NOTE — Telephone Encounter (Signed)
-----   Message from Alvina Chou sent at 12/09/2022  9:36 AM EST ----- Regarding: Lab orders for Wed, 12.4.24 Patient is scheduled for CPX labs, please order future labs, Thanks , Camelia Eng

## 2022-12-22 ENCOUNTER — Other Ambulatory Visit (INDEPENDENT_AMBULATORY_CARE_PROVIDER_SITE_OTHER): Payer: Medicare Other

## 2022-12-22 ENCOUNTER — Encounter: Payer: Medicare Other | Admitting: Family Medicine

## 2022-12-22 DIAGNOSIS — E785 Hyperlipidemia, unspecified: Secondary | ICD-10-CM | POA: Diagnosis not present

## 2022-12-22 DIAGNOSIS — Z79899 Other long term (current) drug therapy: Secondary | ICD-10-CM | POA: Diagnosis not present

## 2022-12-22 DIAGNOSIS — E559 Vitamin D deficiency, unspecified: Secondary | ICD-10-CM | POA: Diagnosis not present

## 2022-12-22 DIAGNOSIS — E119 Type 2 diabetes mellitus without complications: Secondary | ICD-10-CM

## 2022-12-23 LAB — CBC WITH DIFFERENTIAL/PLATELET
Basophils Absolute: 0.1 10*3/uL (ref 0.0–0.1)
Basophils Relative: 0.8 % (ref 0.0–3.0)
Eosinophils Absolute: 0.4 10*3/uL (ref 0.0–0.7)
Eosinophils Relative: 4.9 % (ref 0.0–5.0)
HCT: 43.2 % (ref 36.0–46.0)
Hemoglobin: 14.6 g/dL (ref 12.0–15.0)
Lymphocytes Relative: 36.4 % (ref 12.0–46.0)
Lymphs Abs: 3.2 10*3/uL (ref 0.7–4.0)
MCHC: 33.7 g/dL (ref 30.0–36.0)
MCV: 95.1 fL (ref 78.0–100.0)
Monocytes Absolute: 1 10*3/uL (ref 0.1–1.0)
Monocytes Relative: 10.9 % (ref 3.0–12.0)
Neutro Abs: 4.1 10*3/uL (ref 1.4–7.7)
Neutrophils Relative %: 47 % (ref 43.0–77.0)
Platelets: 263 10*3/uL (ref 150.0–400.0)
RBC: 4.54 Mil/uL (ref 3.87–5.11)
RDW: 13.4 % (ref 11.5–15.5)
WBC: 8.7 10*3/uL (ref 4.0–10.5)

## 2022-12-23 LAB — MICROALBUMIN / CREATININE URINE RATIO
Creatinine,U: 150.5 mg/dL
Microalb Creat Ratio: 0.6 mg/g (ref 0.0–30.0)
Microalb, Ur: 0.9 mg/dL (ref 0.0–1.9)

## 2022-12-23 LAB — LIPID PANEL
Cholesterol: 168 mg/dL (ref 0–200)
HDL: 43.2 mg/dL (ref >39.00)
LDL Cholesterol: 112 mg/dL — ABNORMAL HIGH (ref 0–99)
NonHDL: 124.42
Total CHOL/HDL Ratio: 4
Triglycerides: 61 mg/dL (ref 0.0–149.0)
VLDL: 12.2 mg/dL (ref 0.0–40.0)

## 2022-12-23 LAB — HEPATIC FUNCTION PANEL
ALT: 19 U/L (ref 0–35)
AST: 19 U/L (ref 0–37)
Albumin: 4.2 g/dL (ref 3.5–5.2)
Alkaline Phosphatase: 64 U/L (ref 39–117)
Bilirubin, Direct: 0.1 mg/dL (ref 0.0–0.3)
Total Bilirubin: 0.7 mg/dL (ref 0.2–1.2)
Total Protein: 7.2 g/dL (ref 6.0–8.3)

## 2022-12-23 LAB — BASIC METABOLIC PANEL
BUN: 16 mg/dL (ref 6–23)
CO2: 29 meq/L (ref 19–32)
Calcium: 9.4 mg/dL (ref 8.4–10.5)
Chloride: 102 meq/L (ref 96–112)
Creatinine, Ser: 0.67 mg/dL (ref 0.40–1.20)
GFR: 89.81 mL/min (ref >60.00)
Glucose, Bld: 105 mg/dL — ABNORMAL HIGH (ref 70–99)
Potassium: 4.5 meq/L (ref 3.5–5.1)
Sodium: 139 meq/L (ref 135–145)

## 2022-12-23 LAB — HEMOGLOBIN A1C: Hgb A1c MFr Bld: 6.2 % (ref 4.6–6.5)

## 2022-12-23 LAB — VITAMIN D 25 HYDROXY (VIT D DEFICIENCY, FRACTURES): VITD: 22.41 ng/mL — ABNORMAL LOW (ref 30.00–100.00)

## 2023-01-04 ENCOUNTER — Encounter: Payer: Self-pay | Admitting: Family Medicine

## 2023-01-04 NOTE — Progress Notes (Unsigned)
Summer Hofman T. Shinita Mac, MD, CAQ Sports Medicine [facility] at [facility] [address] [place], [address]  Phone: [phone]  FAX: [phone]  Summer Brown - 67 y.o. female  MRN [id number]  Date of Birth: 1955-04-19  Date: 01/05/2023  PCP: Hannah Beat, MD  Referral: Hannah Beat, MD  No chief complaint on file.  Patient Care Team: Hannah Beat, MD as PCP - General (Family Medicine) Subjective:   Summer Brown is a 67 y.o. pleasant patient who presents with the following:  Health Maintenance Summary Reviewed and updated, unless pt declines services.  Tobacco History Reviewed. Non-smoker Alcohol: No concerns, no excessive use Exercise Habits: Some activity, rec at least 30 mins 5 times a week STD concerns: none Drug Use: None Lumps or breast concerns: no  She is a pleasant lady who I recall quite well.  She is behind on a number of different health maintenance issues.  Prevnar 20 COVID vaccination Eye exam Tdap Shingrix Flu vaccine DEXA scan Mammogram  Health Maintenance  Topic Date Due   COVID-19 Vaccine (1) Never done   Pneumonia Vaccine 5+ Years old (1 of 2 - PCV) Never done   OPHTHALMOLOGY EXAM  Never done   DTaP/Tdap/Td (1 - Tdap) Never done   Zoster Vaccines- Shingrix (1 of 2) Never done   DEXA SCAN  Never done   MAMMOGRAM  10/09/2021   INFLUENZA VACCINE  Never done   FOOT EXAM  12/10/2022   Colonoscopy  01/29/2023   HEMOGLOBIN A1C  06/22/2023   Medicare Annual Wellness (AWV)  09/21/2023   Diabetic kidney evaluation - eGFR measurement  12/22/2023   Diabetic kidney evaluation - Urine ACR  12/22/2023   Hepatitis C Screening  Completed   HPV VACCINES  Aged Out     There is no immunization history on file for this patient. Patient Active Problem List   Diagnosis Date Noted   Controlled type 2 diabetes mellitus without complication, without long-term current use of insulin (HCC) 11/06/2016    Priority:  High   Morbid obesity (HCC) 08/07/2014    Priority: Medium    Chronic venous insufficiency 08/07/2014   Chronic back pain 12/12/2013    Past Medical History:  Diagnosis Date   Angio-edema    Chronic back pain 12/12/2013   Controlled type 2 diabetes mellitus without complication, without long-term current use of insulin (HCC) 11/06/2016   Hypertension    Osteoporosis    Ulcer     Past Surgical History:  Procedure Laterality Date   BACK SURGERY     CERVICAL SPINE SURGERY     FRACTURE SURGERY     NOSE SURGERY     OVARIAN CYST SURGERY     SPINE SURGERY      Family History  Problem Relation Age of Onset   Heart attack Mother    Diabetes Father    Colon cancer Neg Hx    Esophageal cancer Neg Hx    Rectal cancer Neg Hx    Stomach cancer Neg Hx     Social History   Social History Narrative   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  Review of Systems: Pertinent positives are listed above.  Otherwise, a full 14 point review of systems has been done in full and it is negative except where it is noted positive.  Objective:   There were no vitals taken for this visit. Ideal Body Weight:   No results found.    09/21/2022    2:03 PM 09/17/2021    1:46 PM 08/28/2020    2:26 PM 09/12/2014   10:33 AM  Depression screen PHQ 2/9  Decreased Interest 0 0 0 0  Down, Depressed, Hopeless 0 0 0 0  PHQ - 2 Score 0 0 0 0  Altered sleeping  0    Tired, decreased energy  0    Change in appetite  0    Feeling bad or failure about yourself   0    Trouble concentrating  0    Moving slowly or fidgety/restless  0    Suicidal thoughts  0    PHQ-9 Score  0    Difficult doing work/chores  Not difficult at all       GEN: well developed, well nourished, no acute distress Eyes: conjunctiva and lids normal, PERRLA, EOMI ENT: TM  clear, nares clear, oral exam WNL Neck: supple, no lymphadenopathy, no thyromegaly, no JVD Pulm: clear to auscultation and percussion, respiratory effort normal CV: regular rate and rhythm, S1-S2, no murmur, rub or gallop, no bruits Chest: no scars, masses, no lumps BREAST: breast exam declined GI: soft, non-tender; no hepatosplenomegaly, masses; active bowel sounds all quadrants GU: GU exam declined Lymph: no cervical, axillary or inguinal adenopathy MSK: gait normal, muscle tone and strength WNL, no joint swelling, effusions, discoloration, crepitus  SKIN: clear, good turgor, color WNL, no rashes, lesions, or ulcerations Neuro: normal mental status, normal strength, sensation, and motion Psych: alert; oriented to person, place and time, normally interactive and not anxious or depressed in appearance.   All labs reviewed with patient. Results for orders placed or performed in visit on 12/22/22  VITAMIN D 25 Hydroxy (Vit-D Deficiency, Fractures)   Collection Time: 12/22/22  4:17 PM  Result Value Ref Range   VITD 22.41 (L) 30.00 - 100.00 ng/mL  Hepatic Function Panel   Collection Time: 12/22/22  4:17 PM  Result Value Ref Range   Total Bilirubin 0.7 0.2 - 1.2 mg/dL   Bilirubin, Direct 0.1 0.0 - 0.3 mg/dL   Alkaline Phosphatase 64 39 - 117 U/L   AST 19 0 - 37 U/L   ALT 19 0 - 35 U/L   Total Protein 7.2 6.0 - 8.3 g/dL   Albumin 4.2 3.5 - 5.2 g/dL  Basic metabolic panel   Collection Time: 12/22/22  4:17 PM  Result Value Ref Range   Sodium 139 135 - 145 mEq/L   Potassium 4.5 3.5 - 5.1 mEq/L   Chloride 102 96 - 112 mEq/L   CO2 29 19 - 32 mEq/L   Glucose, Bld 105 (H) 70 - 99 mg/dL   BUN 16 6 - 23 mg/dL   Creatinine, Ser 4.40 0.40 - 1.20 mg/dL   GFR 34.74 >25.95 mL/min   Calcium 9.4 8.4 - 10.5 mg/dL  CBC with Differential/Platelet   Collection Time: 12/22/22  4:17 PM  Result Value Ref Range   WBC 8.7 4.0 - 10.5 K/uL   RBC 4.54 3.87 - 5.11 Mil/uL   Hemoglobin 14.6 12.0 - 15.0  g/dL   HCT 63.8 75.6 - 43.3 %   MCV 95.1 78.0 - 100.0 fl   MCHC 33.7 30.0 - 36.0 g/dL   RDW 29.5 18.8 - 41.6 %   Platelets 263.0 150.0 - 400.0 K/uL   Neutrophils Relative % 47.0 43.0 - 77.0 %  Lymphocytes Relative 36.4 12.0 - 46.0 %   Monocytes Relative 10.9 3.0 - 12.0 %   Eosinophils Relative 4.9 0.0 - 5.0 %   Basophils Relative 0.8 0.0 - 3.0 %   Neutro Abs 4.1 1.4 - 7.7 K/uL   Lymphs Abs 3.2 0.7 - 4.0 K/uL   Monocytes Absolute 1.0 0.1 - 1.0 K/uL   Eosinophils Absolute 0.4 0.0 - 0.7 K/uL   Basophils Absolute 0.1 0.0 - 0.1 K/uL  Microalbumin / creatinine urine ratio   Collection Time: 12/22/22  4:17 PM  Result Value Ref Range   Microalb, Ur 0.9 0.0 - 1.9 mg/dL   Creatinine,U 161.0 mg/dL   Microalb Creat Ratio 0.6 0.0 - 30.0 mg/g  Lipid panel   Collection Time: 12/22/22  4:17 PM  Result Value Ref Range   Cholesterol 168 0 - 200 mg/dL   Triglycerides 96.0 0.0 - 149.0 mg/dL   HDL 45.40 >98.11 mg/dL   VLDL 91.4 0.0 - 78.2 mg/dL   LDL Cholesterol 956 (H) 0 - 99 mg/dL   Total CHOL/HDL Ratio 4    NonHDL 124.42   Hemoglobin A1c   Collection Time: 12/22/22  4:17 PM  Result Value Ref Range   Hgb A1c MFr Bld 6.2 4.6 - 6.5 %   No results found.  Assessment and Plan:     ICD-10-CM   1. Healthcare maintenance  Z00.00       Health Maintenance Exam: The patient's preventative maintenance and recommended screening tests for an annual wellness exam were reviewed in full today. Brought up to date unless services declined.  Counselled on the importance of diet, exercise, and its role in overall health and mortality. The patient's FH and SH was reviewed, including their home life, tobacco status, and drug and alcohol status.  Follow-up in 1 year for physical exam or additional follow-up below.  Disposition: No follow-ups on file.  Future Appointments  Date Time Provider Department Center  01/05/2023  3:20 PM Hannah Beat, MD [facility]  09/29/2023  2:00 PM [facility] ANNUAL  WELLNESS VISIT 1 [facility]    No orders of the defined types were placed in this encounter.  There are no discontinued medications. No orders of the defined types were placed in this encounter.   Signed,  Elpidio Galea. Viaan Knippenberg, MD   Allergies as of 01/05/2023       Reactions   Codeine Itching   Iodine Hives   Sodium Swelling        Medication List        Accurate as of January 04, 2023  5:56 PM. If you have any questions, ask your nurse or doctor.          EPINEPHrine 0.3 mg/0.3 mL Soaj injection Commonly known as: EPI-PEN Inject 0.3 mg into the muscle as needed for anaphylaxis.   glipiZIDE 5 MG 24 hr tablet Commonly known as: GLUCOTROL XL TAKE 2 TABLETS(10 MG) BY MOUTH DAILY WITH BREAKFAST

## 2023-01-05 ENCOUNTER — Encounter: Payer: Self-pay | Admitting: Family Medicine

## 2023-01-05 ENCOUNTER — Ambulatory Visit (INDEPENDENT_AMBULATORY_CARE_PROVIDER_SITE_OTHER): Payer: Medicare Other | Admitting: Family Medicine

## 2023-01-05 VITALS — BP 130/86 | HR 90 | Temp 98.1°F | Ht 66.0 in | Wt 258.2 lb

## 2023-01-05 DIAGNOSIS — E119 Type 2 diabetes mellitus without complications: Secondary | ICD-10-CM | POA: Diagnosis not present

## 2023-01-05 DIAGNOSIS — Z0000 Encounter for general adult medical examination without abnormal findings: Secondary | ICD-10-CM

## 2023-01-05 NOTE — Patient Instructions (Addendum)
Vitamin D supplement 2,000 units a day  You do not need a referral to make a mammogram appointment, and you may call to make her own mammogram appointment directly around your schedule.  [facility] [phone] [address] [place], [place] [address]

## 2023-02-06 ENCOUNTER — Ambulatory Visit
Admission: EM | Admit: 2023-02-06 | Discharge: 2023-02-06 | Disposition: A | Discharge status: Home / Self Care | Payer: Medicare Other | Attending: Physician Assistant | Admitting: Physician Assistant

## 2023-02-06 DIAGNOSIS — U071 COVID-19: Secondary | ICD-10-CM | POA: Diagnosis not present

## 2023-02-06 LAB — POC COVID19/FLU A&B COMBO
Covid Antigen, POC: POSITIVE — AB
Influenza A Antigen, POC: NEGATIVE
Influenza B Antigen, POC: NEGATIVE

## 2023-02-06 NOTE — ED Triage Notes (Signed)
+   at home covid test; Chills; Fever (not current); work note, req proof of + covid test for work -entered by patient.  "Symptoms had began on Wednesday evening after out at [employer]".

## 2023-02-10 NOTE — ED Provider Notes (Signed)
[facility]    CSN: [id number] Arrival date & time: 02/06/23  1056      History   Chief Complaint Chief Complaint  Patient presents with   COVID19    + @ homes test.    HPI Summer Brown is a 68 y.o. female.   Patient here today for evaluation of COVID-19.  She reports she had a positive test and has had chills, fever, but this is resolved.  Symptoms started about 4 days ago but she states she needs a work note and confirmation of screening to excuse absence.  She reports overall she is feeling better but still somewhat symptomatic with congestion.  The history is provided by the patient.    Past Medical History:  Diagnosis Date   Angio-edema    Chronic back pain 12/12/2013   Controlled type 2 diabetes mellitus without complication, without long-term current use of insulin (HCC) 11/06/2016   Hypertension    Osteoporosis    Ulcer     Patient Active Problem List   Diagnosis Date Noted   Controlled type 2 diabetes mellitus without complication, without long-term current use of insulin (HCC) 11/06/2016    Past Surgical History:  Procedure Laterality Date   BACK SURGERY     CERVICAL SPINE SURGERY     FRACTURE SURGERY     NOSE SURGERY     OVARIAN CYST SURGERY     SPINE SURGERY      OB History   No obstetric history on file.      Home Medications    Prior to Admission medications   Medication Sig Start Date End Date Taking? Authorizing Provider  EPINEPHrine 0.3 mg/0.3 mL IJ SOAJ injection Inject 0.3 mg into the muscle as needed for anaphylaxis. 08/28/20   Copland, Karleen Hampshire, MD    Family History Family History  Problem Relation Age of Onset   Heart attack Mother    Diabetes Father    Colon cancer Neg Hx    Esophageal cancer Neg Hx    Rectal cancer Neg Hx    Stomach cancer Neg Hx     Social History Social History   Tobacco Use   Smoking status: Never    Passive exposure: Never   Smokeless tobacco: Never  Vaping Use   Vaping status:  Never Used  Substance Use Topics   Alcohol use: Not Currently   Drug use: No     Allergies   Codeine, Iodine, and Sodium   Review of Systems Review of Systems  Constitutional:  Negative for chills and fever.  HENT:  Positive for congestion. Negative for ear pain and sore throat.   Eyes:  Negative for discharge and redness.  Respiratory:  Positive for cough. Negative for shortness of breath and wheezing.   Gastrointestinal:  Negative for abdominal pain, diarrhea, nausea and vomiting.     Physical Exam Triage Vital Signs ED Triage Vitals  Encounter Vitals Group     BP 02/06/23 1135 130/69     Systolic BP Percentile --      Diastolic BP Percentile --      Pulse Rate 02/06/23 1135 76     Resp 02/06/23 1135 18     Temp 02/06/23 1135 98.1 F (36.7 C)     Temp Source 02/06/23 1135 Oral     SpO2 02/06/23 1135 97 %     Weight 02/06/23 1134 258 lb 2.5 oz (117.1 kg)     Height 02/06/23 1134 5\' 6"  (1.676 m)  Head Circumference --      Peak Flow --      Pain Score 02/06/23 1133 0     Pain Loc --      Pain Education --      Exclude from Growth Chart --    No data found.  Updated Vital Signs BP 130/69 (BP Location: Left Arm)   Pulse 76   Temp 98.1 F (36.7 C) (Oral)   Resp 18   Ht 5\' 6"  (1.676 m)   Wt 258 lb 2.5 oz (117.1 kg)   SpO2 97%   BMI 41.67 kg/m   Visual Acuity Right Eye Distance:   Left Eye Distance:   Bilateral Distance:    Right Eye Near:   Left Eye Near:    Bilateral Near:     Physical Exam Vitals and nursing note reviewed.  Constitutional:      General: She is not in acute distress.    Appearance: Normal appearance. She is not ill-appearing.  HENT:     Head: Normocephalic and atraumatic.     Nose: Congestion present.     Mouth/Throat:     Mouth: Mucous membranes are moist.     Pharynx: No oropharyngeal exudate or posterior oropharyngeal erythema.  Eyes:     Conjunctiva/sclera: Conjunctivae normal.  Cardiovascular:     Rate and Rhythm:  Normal rate and regular rhythm.     Heart sounds: Normal heart sounds. No murmur heard. Pulmonary:     Effort: Pulmonary effort is normal. No respiratory distress.     Breath sounds: Normal breath sounds. No wheezing, rhonchi or rales.  Skin:    General: Skin is warm and dry.  Neurological:     Mental Status: She is alert.  Psychiatric:        Mood and Affect: Mood normal.        Thought Content: Thought content normal.      UC Treatments / Results  Labs (all labs ordered are listed, but only abnormal results are displayed) Labs Reviewed  POC COVID19/FLU A&B COMBO - Abnormal; Notable for the following components:      Result Value   Covid Antigen, POC Positive (*)    All other components within normal limits    EKG   Radiology No results found.  Procedures Procedures (including critical care time)  Medications Ordered in UC Medications - No data to display  Initial Impression / Assessment and Plan / UC Course  I have reviewed the triage vital signs and the nursing notes.  Pertinent labs & imaging results that were available during my care of the patient were reviewed by me and considered in my medical decision making (see chart for details).    Point-of-care COVID screening positive.  Work note provided as requested.  Advise follow-up if no continued gradual improvement with any further concerns. \ Final Clinical Impressions(s) / UC Diagnoses   Final diagnoses:  COVID-19   Discharge Instructions   None    ED Prescriptions   None    PDMP not reviewed this encounter.   Tomi Bamberger, PA-C 02/10/23 [phone]

## 2023-03-01 ENCOUNTER — Encounter: Payer: Self-pay | Admitting: Gastroenterology

## 2023-03-15 ENCOUNTER — Other Ambulatory Visit: Payer: Self-pay

## 2023-03-15 DIAGNOSIS — Z1231 Encounter for screening mammogram for malignant neoplasm of breast: Secondary | ICD-10-CM

## 2023-03-25 ENCOUNTER — Ambulatory Visit
Admission: RE | Admit: 2023-03-25 | Discharge: 2023-03-25 | Disposition: A | Discharge status: Home / Self Care | Payer: Medicare Other | Source: Ambulatory Visit | Attending: Family Medicine | Admitting: Family Medicine

## 2023-03-25 DIAGNOSIS — Z1231 Encounter for screening mammogram for malignant neoplasm of breast: Secondary | ICD-10-CM

## 2023-04-04 ENCOUNTER — Encounter: Payer: Self-pay | Admitting: Nurse Practitioner

## 2023-05-13 ENCOUNTER — Ambulatory Visit: Payer: Self-pay

## 2023-05-13 NOTE — Telephone Encounter (Signed)
 Copied from CRM [id number]. Topic: Clinical - Red Word Triage >> May 13, 2023  3:17 PM Deaijah H wrote: Red Word that prompted transfer to Nurse Triage: Lot of pain in left hip feels so bad at times that she feel as if she may fall, swollen, & getting worse. Left leg. Chief Complaint: pain leg Symptoms: pain in upper thigh & joint area, swelling to area, weakness Frequency: ongoing and worsening x couple of months Pertinent Negatives: Patient denies redness, fever, SOB Disposition: [] ED /[] Urgent Care (no appt availability in office) / [x] Appointment(In office/virtual)/ []  [place] [facility]/ [] Home Care/ [] Refused Recommended Disposition /[] [facility]/ []  Follow-up with PCP Additional Notes: pt sometimes not able to sleep at night due to pain :taking Excedrin or tylenol and pain patch to help w/o relief. Pt states sometimes she feels leg is going to give out on her.  Reason for Disposition  [1] MODERATE pain (e.g., interferes with normal activities) AND [2] present > 3 days  Answer Assessment - Initial Assessment Questions 1. ONSET: "When did the pain start?"       Ongoing worsening and x couple of months 2. LOCATION: "Where is the pain located?"      Upper left thigh  area to goes in to the joint 3. PAIN: "How bad is the pain?"    (Scale 1-10; or mild, moderate, severe)   -  MILD (1-3): doesn't interfere with normal activities    -  MODERATE (4-7): interferes with normal activities (e.g., work or school) or awakens from sleep, limping    -  SEVERE (8-10): excruciating pain, unable to do any normal activities, unable to walk     8-9/10  4. WORK OR EXERCISE: "Has there been any recent work or exercise that involved this part of the body?"      no 5. CAUSE: "What do you think is causing the leg pain?"     unknown 6. OTHER SYMPTOMS: "Do you have any other symptoms?" (e.g., chest pain, back pain, breathing difficulty, swelling, rash, fever, numbness, weakness)     Pain,  swelling, weakness in left leg - feels like leg is going to give out 7. PREGNANCY: "Is there any chance you are pregnant?" "When was your last menstrual period?"     no  Protocols used: Arm Pain-A-AH, Leg Pain-A-AH

## 2023-05-15 NOTE — Progress Notes (Unsigned)
     Khalin Royce T. Anthonyjames Bargar, MD, CAQ Sports Medicine [facility] at [facility] [address] [place], [address]  Phone: [phone]  FAX: [phone]  Summer Brown - 68 y.o. female  MRN [id number]  Date of Birth: 11-07-1955  Date: 05/16/2023  PCP: Scherrie Curt, MD  Referral: Scherrie Curt, MD  No chief complaint on file.  Subjective:   Summer Brown is a 68 y.o. very pleasant female patient with There is no height or weight on file to calculate BMI. who presents with the following:  Patient presents for left leg pain.    Review of Systems is noted in the HPI, as appropriate  Objective:   There were no vitals taken for this visit.  GEN: No acute distress; alert,appropriate. PULM: Breathing comfortably in no respiratory distress PSYCH: Normally interactive.   Laboratory and Imaging Data:  Assessment and Plan:   ***

## 2023-05-16 ENCOUNTER — Ambulatory Visit (INDEPENDENT_AMBULATORY_CARE_PROVIDER_SITE_OTHER): Admitting: Family Medicine

## 2023-05-16 ENCOUNTER — Encounter: Payer: Self-pay | Admitting: Family Medicine

## 2023-05-16 ENCOUNTER — Ambulatory Visit
Admission: RE | Admit: 2023-05-16 | Discharge: 2023-05-16 | Disposition: A | Discharge status: Home / Self Care | Source: Ambulatory Visit | Attending: Family Medicine | Admitting: Family Medicine

## 2023-05-16 VITALS — BP 134/86 | HR 85 | Temp 97.8°F | Ht 66.0 in | Wt 248.4 lb

## 2023-05-16 DIAGNOSIS — M1612 Unilateral primary osteoarthritis, left hip: Secondary | ICD-10-CM

## 2023-05-16 MED ORDER — CELECOXIB 200 MG PO CAPS: 200.0000 mg | ORAL_CAPSULE | Freq: Every day | ORAL | 2 refills | Status: DC

## 2023-09-24 ENCOUNTER — Ambulatory Visit
Admission: EM | Admit: 2023-09-24 | Discharge: 2023-09-24 | Disposition: A | Discharge status: Home / Self Care | Attending: Emergency Medicine | Admitting: Emergency Medicine

## 2023-09-24 ENCOUNTER — Encounter: Payer: Self-pay | Admitting: Emergency Medicine

## 2023-09-24 DIAGNOSIS — J069 Acute upper respiratory infection, unspecified: Secondary | ICD-10-CM

## 2023-09-24 LAB — POC SOFIA SARS ANTIGEN FIA: SARS Coronavirus 2 Ag: NEGATIVE

## 2023-09-24 NOTE — ED Triage Notes (Signed)
 Pt presents c/o coughing, chills, and nasal congestion x 3 days. Pt request COVID test so that she can return to work. Pt denies emesis but does c/o diarrhea. Pt also c/o hazy eyes.

## 2023-09-24 NOTE — ED Provider Notes (Signed)
 [facility]    CSN: [id number] Arrival date & time: 09/24/23  1233      History   Chief Complaint Chief Complaint  Patient presents with   URI   Letter for School/Work    HPI Summer Brown is a 68 y.o. female.  3-4 day history of nasal congestion, cough, and chills Would like a covid test today Not having fever No abd pain or NVD Tolerating fluids, normal appetite   Sick contacts possible   Past Medical History:  Diagnosis Date   Angio-edema    Chronic back pain 12/12/2013   Controlled type 2 diabetes mellitus without complication, without long-term current use of insulin (HCC) 11/06/2016   Hypertension    Osteoporosis    Ulcer     Patient Active Problem List   Diagnosis Date Noted   Controlled type 2 diabetes mellitus without complication, without long-term current use of insulin (HCC) 11/06/2016    Past Surgical History:  Procedure Laterality Date   BACK SURGERY     CERVICAL SPINE SURGERY     FRACTURE SURGERY     NOSE SURGERY     OVARIAN CYST SURGERY     SPINE SURGERY      OB History   No obstetric history on file.      Home Medications    Prior to Admission medications   Medication Sig Start Date End Date Taking? Authorizing Provider  celecoxib  (CELEBREX ) 200 MG capsule Take 1 capsule (200 mg total) by mouth daily. 05/16/23   Copland, Jacques, MD  EPINEPHrine  0.3 mg/0.3 mL IJ SOAJ injection Inject 0.3 mg into the muscle as needed for anaphylaxis. 08/28/20   Copland, Jacques, MD    Family History Family History  Problem Relation Age of Onset   Heart attack Mother    Diabetes Father    Colon cancer Neg Hx    Esophageal cancer Neg Hx    Rectal cancer Neg Hx    Stomach cancer Neg Hx    Breast cancer Neg Hx     Social History Social History   Tobacco Use   Smoking status: Never    Passive exposure: Never   Smokeless tobacco: Never  Vaping Use   Vaping status: Never Used  Substance Use Topics   Alcohol use: Not Currently    Drug use: No     Allergies   Codeine, Iodine, and Sodium   Review of Systems Review of Systems  As per HPI  Physical Exam Triage Vital Signs ED Triage Vitals  Encounter Vitals Group     BP 09/24/23 1343 (!) 159/81     Girls Systolic BP Percentile --      Girls Diastolic BP Percentile --      Boys Systolic BP Percentile --      Boys Diastolic BP Percentile --      Pulse Rate 09/24/23 1343 73     Resp 09/24/23 1343 18     Temp 09/24/23 1343 98.3 F (36.8 C)     Temp Source 09/24/23 1343 Oral     SpO2 09/24/23 1343 98 %     Weight 09/24/23 1342 248 lb 7.3 oz (112.7 kg)     Height --      Head Circumference --      Peak Flow --      Pain Score 09/24/23 1341 6     Pain Loc --      Pain Education --      Exclude from [employer]  Chart --    No data found.  Updated Vital Signs BP (!) 159/81 (BP Location: Left Arm)   Pulse 73   Temp 98.3 F (36.8 C) (Oral)   Resp 18   Wt 248 lb 7.3 oz (112.7 kg)   SpO2 98%   BMI 40.10 kg/m    Physical Exam Vitals and nursing note reviewed.  Constitutional:      General: She is not in acute distress.    Appearance: She is not diaphoretic.  HENT:     Nose: No rhinorrhea.     Mouth/Throat:     Mouth: Mucous membranes are moist.     Pharynx: Oropharynx is clear. No posterior oropharyngeal erythema.  Eyes:     Conjunctiva/sclera: Conjunctivae normal.  Cardiovascular:     Rate and Rhythm: Normal rate and regular rhythm.     Pulses: Normal pulses.     Heart sounds: Normal heart sounds.  Pulmonary:     Effort: Pulmonary effort is normal.     Breath sounds: Normal breath sounds.  Musculoskeletal:     Cervical back: Normal range of motion.  Lymphadenopathy:     Cervical: No cervical adenopathy.  Skin:    General: Skin is warm and dry.  Neurological:     Mental Status: She is alert and oriented to person, place, and time.     UC Treatments / Results  Labs (all labs ordered are listed, but only abnormal results are  displayed) Labs Reviewed  POC SOFIA SARS ANTIGEN FIA    EKG   Radiology No results found.  Procedures Procedures (including critical care time)  Medications Ordered in UC Medications - No data to display  Initial Impression / Assessment and Plan / UC Course  I have reviewed the triage vital signs and the nursing notes.  Pertinent labs & imaging results that were available during my care of the patient were reviewed by me and considered in my medical decision making (see chart for details).  Covid test negative Afebrile, well appearing, clear lungs Discussed supportive care at home.  Advised OTC options to use for symptoms.  Discussed likely viral prognosis.  Patient reports she needs to return to work on Monday.  A note for work is provided, advise wearing a mask, washing hands, social distancing, disinfecting.  Agrees with plan, all questions answered   At discharge she requested an antibiotic.  Discussed this is likely a viral illness and antibiotics are not appropriate at this time.  Final Clinical Impressions(s) / UC Diagnoses   Final diagnoses:  Viral upper respiratory tract infection     Discharge Instructions      Continue supportive care I recommend plain mucinex (guaifenesin) for congestion This only works if you are very hydrated so drink extra water Please return if needed     ED Prescriptions   None    PDMP not reviewed this encounter.   Nadja Lina, [place], [place] 09/24/23 1531

## 2023-09-24 NOTE — Discharge Instructions (Addendum)
 Continue supportive care I recommend plain mucinex (guaifenesin) for congestion This only works if you are very hydrated so drink extra water Please return if needed

## 2023-10-07 ENCOUNTER — Ambulatory Visit

## 2023-12-28 ENCOUNTER — Telehealth: Payer: Self-pay | Admitting: Family Medicine

## 2023-12-28 ENCOUNTER — Ambulatory Visit (INDEPENDENT_AMBULATORY_CARE_PROVIDER_SITE_OTHER): Admitting: Family Medicine

## 2023-12-28 ENCOUNTER — Encounter: Payer: Self-pay | Admitting: Family Medicine

## 2023-12-28 VITALS — BP 140/70 | HR 95 | Temp 99.0°F | Ht 66.0 in | Wt 234.2 lb

## 2023-12-28 DIAGNOSIS — E119 Type 2 diabetes mellitus without complications: Secondary | ICD-10-CM

## 2023-12-28 DIAGNOSIS — R5383 Other fatigue: Secondary | ICD-10-CM | POA: Diagnosis not present

## 2023-12-28 DIAGNOSIS — M1612 Unilateral primary osteoarthritis, left hip: Secondary | ICD-10-CM

## 2023-12-28 LAB — POCT GLYCOSYLATED HEMOGLOBIN (HGB A1C): Hemoglobin A1C: 5.9 % — AB (ref 4.0–5.6)

## 2023-12-28 LAB — MICROALBUMIN / CREATININE URINE RATIO
Creatinine,U: 202.9 mg/dL
Microalb Creat Ratio: 6.5 mg/g (ref 0.0–30.0)
Microalb, Ur: 1.3 mg/dL (ref 0.0–1.9)

## 2023-12-28 NOTE — Telephone Encounter (Signed)
 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 Patient stated she needed paperwork to her job today as she has is going to lose her job.   Informed the patient that the Provider could not complete forms without seeing her.  Patient made an appointment for Monday 12.15.25 at 8:40 am to see the MD.  Added patient to wait list in the event there is an earlier appointment.

## 2023-12-28 NOTE — Progress Notes (Signed)
 Raynetta Osterloh T. Johnaton Sonneborn, MD, CAQ Sports Medicine [facility] at [facility] [address] [place], [address]  Phone: [phone]  FAX: [phone]  Summer Brown - 68 y.o. female  MRN [id number]  Date of Birth: [birth year]  Date: 12/28/2023  PCP: Watt Mirza, MD  Referral: Watt Mirza, MD  Chief Complaint  Patient presents with   Form Completion   Subjective:   Summer Brown is a 68 y.o. very pleasant female patient with Body mass index is 37.81 kg/m. who presents with the following:  Discussed the use of AI scribe software for clinical note transcription with the patient, who gave verbal consent to proceed.  See attached letter  I received a letter for the certification of fitness for duty for the patient's employer.  The included observations from December 23, 2023: Quotation she is unsteady on her feet when walking through the plant.  She was observed reaching for objects as she walked to keep her balance.  On December 10, 2023 she fell at work because she was unbalanced and tripped over her foot.  She works in a plant with heavy academic librarian, forklifts, cranes, and she must be able to move around safely in the plant.  Please review attached job description terms she is fit to return to work.  The patient has significant anterior groin pain lateral hip pain and pain with movement of the hip.  She is limping and having difficulty standing in the office today.  Review of Systems is noted in the HPI, as appropriate  Objective:   BP (!) 140/70   Pulse 95   Temp 99 F (37.2 C) (Temporal)   Ht 5' 6 (1.676 m)   Wt 234 lb 4 oz (106.3 kg)   SpO2 97%   BMI 37.81 kg/m   GEN: No acute distress; alert,appropriate. PULM: Breathing comfortably in no respiratory distress PSYCH: Normally interactive.   Left hip: She has significant pain with abduction of 10 degrees of the left hip. No significant lateral pain or pain at the  trochanteric bursa The patient is unable to flex her hip to 90 degrees With the hip flexed maximally, she is unable to rotate her hip and internal or external range of motion at all  Laboratory and Imaging Data: CLINICAL DATA:  Groin pain.  Known hip osteoarthritis.   EXAM: DG HIP (WITH OR WITHOUT PELVIS) 2-3V LEFT   COMPARISON:  Pelvis and right hip radiographs 02/17/2020, CT abdomen and pelvis 08/19/2021   FINDINGS: Mild bilateral sacroiliac subchondral sclerosis. Moderate pubic symphysis joint space narrowing and subchondral sclerosis.   Moderate to severe anterior superior left femoroacetabular joint space narrowing with focal bone-on-bone contact and mild subchondral cystic change. Moderate left femoral head-neck junction degenerative osteophytosis. Only minimal right femoroacetabular joint space narrowing. The left hip degenerative changes appear mildly worsened from 08/19/2021 CT and definitively worsened from 02/17/2020 radiographs.   L5-S1 bilateral transpedicular rod and screw fusion hardware with associated intervertebral disc spacer. Mild levocurvature centered at L4 with moderate right L3-4 and L4-5 disc space narrowing.   No acute fracture or dislocation.   IMPRESSION: 1. Moderate to severe left femoroacetabular osteoarthritis, progressively worsened from comparison 2023 and 2022 studies. 2. Moderate pubic symphysis osteoarthritis.     Electronically Signed   By: Tanda Lyons M.D.   On: 05/26/2023 17:00  Results for orders placed or performed in visit on 12/28/23  POCT glycosylated hemoglobin (Hb A1C)   Collection Time: 12/28/23  2:37 PM  Result Value Ref Range   Hemoglobin A1C 5.9 (A) 4.0 - 5.6 %   HbA1c POC (<> result, manual entry)     HbA1c, POC (prediabetic range)     HbA1c, POC (controlled diabetic range)       Assessment and Plan:     ICD-10-CM   1. Primary osteoarthritis of left hip  M16.12 Ambulatory referral to Orthopedic Surgery    2.  Controlled type 2 diabetes mellitus without complication, without long-term current use of insulin (HCC)  E11.9 Microalbumin / creatinine urine ratio    POCT glycosylated hemoglobin (Hb A1C)    Basic metabolic panel with GFR    3. Other fatigue  R53.83 Basic metabolic panel with GFR    CBC with Differential/Platelet    Hepatic function panel    TSH     Total encounter time: 30 minutes. This includes total time spent on the day of encounter.  Long conversation about functional capacity, employment, job requirements, and hip arthritis.  I believe that the patient's walking, limping, and difficulty standing and movement is secondary to severe left-sided hip arthritis.  Clinically, the patient has severe hip arthritis and she is minimally able to rotate the hip.  She does have advanced hip arthritis on left-sided hip her x-rays from earlier in the year.  I am going to refer the patient to [facility] for evaluation.  I suspect that if she had a total hip arthroplasty, she would be able to move and walk much more normally and without pain and limping.  I have included a letter in addition to her certification of fitness for duty paperwork for his employer.  At this time I have stated on this paperwork that the patient is unable to return to work at her current position.  Please consider a work accommodation for the patient to work in a lighter duty employment capacity, where she does not have to work with machinery and has much less walking.  Medication Management during today's office visit: No orders of the defined types were placed in this encounter.  Medications Discontinued During This Encounter  Medication Reason   celecoxib  (CELEBREX ) 200 MG capsule Completed Course    Orders placed today for conditions managed today: Orders Placed This Encounter  Procedures   Microalbumin / creatinine urine ratio   Basic metabolic panel with GFR   CBC with Differential/Platelet   Hepatic function  panel   TSH   Ambulatory referral to Orthopedic Surgery   POCT glycosylated hemoglobin (Hb A1C)    Disposition: No follow-ups on file.  Dr[employer] One speech-to-text software was used for transcription in this dictation.  Possible transcriptional errors can occur using Animal nutritionist.   Signed,  Jacques DASEN. Chelsi Warr, MD   Outpatient Encounter Medications as of 12/28/2023  Medication Sig   EPINEPHrine  0.3 mg/0.3 mL IJ SOAJ injection Inject 0.3 mg into the muscle as needed for anaphylaxis.   [DISCONTINUED] celecoxib  (CELEBREX ) 200 MG capsule Take 1 capsule (200 mg total) by mouth daily.   No facility-administered encounter medications on file as of 12/28/2023.

## 2023-12-29 ENCOUNTER — Ambulatory Visit: Payer: Self-pay | Admitting: Family Medicine

## 2023-12-29 LAB — BASIC METABOLIC PANEL WITH GFR
BUN: 19 mg/dL (ref 6–23)
CO2: 31 meq/L (ref 19–32)
Calcium: 9.4 mg/dL (ref 8.4–10.5)
Chloride: 103 meq/L (ref 96–112)
Creatinine, Ser: 0.74 mg/dL (ref 0.40–1.20)
GFR: 82.55 mL/min (ref >60.00)
Glucose, Bld: 124 mg/dL — ABNORMAL HIGH (ref 70–99)
Potassium: 4.4 meq/L (ref 3.5–5.1)
Sodium: 142 meq/L (ref 135–145)

## 2023-12-29 LAB — HEPATIC FUNCTION PANEL
ALT: 12 U/L (ref 0–35)
AST: 13 U/L (ref 0–37)
Albumin: 4.1 g/dL (ref 3.5–5.2)
Alkaline Phosphatase: 72 U/L (ref 39–117)
Bilirubin, Direct: 0.1 mg/dL (ref 0.0–0.3)
Total Bilirubin: 0.4 mg/dL (ref 0.2–1.2)
Total Protein: 6.9 g/dL (ref 6.0–8.3)

## 2023-12-29 LAB — CBC WITH DIFFERENTIAL/PLATELET
Basophils Absolute: 0 K/uL (ref 0.0–0.1)
Basophils Relative: 0.7 % (ref 0.0–3.0)
Eosinophils Absolute: 0.3 K/uL (ref 0.0–0.7)
Eosinophils Relative: 4.9 % (ref 0.0–5.0)
HCT: 41 % (ref 36.0–46.0)
Hemoglobin: 14.1 g/dL (ref 12.0–15.0)
Lymphocytes Relative: 29.3 % (ref 12.0–46.0)
Lymphs Abs: 2.1 K/uL (ref 0.7–4.0)
MCHC: 34.4 g/dL (ref 30.0–36.0)
MCV: 93.2 fl (ref 78.0–100.0)
Monocytes Absolute: 0.6 K/uL (ref 0.1–1.0)
Monocytes Relative: 8.9 % (ref 3.0–12.0)
Neutro Abs: 3.9 K/uL (ref 1.4–7.7)
Neutrophils Relative %: 56.2 % (ref 43.0–77.0)
Platelets: 258 K/uL (ref 150.0–400.0)
RBC: 4.4 Mil/uL (ref 3.87–5.11)
RDW: 13.1 % (ref 11.5–15.5)
WBC: 7 K/uL (ref 4.0–10.5)

## 2023-12-29 LAB — TSH: TSH: 4.25 u[IU]/mL (ref 0.35–5.50)

## 2023-12-29 NOTE — Telephone Encounter (Signed)
 If possible, can you all call her?  I am pretty sure that she does not use mychart.

## 2024-01-02 ENCOUNTER — Ambulatory Visit: Admitting: Family Medicine

## 2024-01-26 ENCOUNTER — Encounter: Payer: Self-pay | Admitting: Orthopaedic Surgery

## 2024-01-26 ENCOUNTER — Ambulatory Visit (INDEPENDENT_AMBULATORY_CARE_PROVIDER_SITE_OTHER): Admitting: Orthopaedic Surgery

## 2024-01-26 VITALS — Ht 66.0 in | Wt 234.0 lb

## 2024-01-26 DIAGNOSIS — M1612 Unilateral primary osteoarthritis, left hip: Secondary | ICD-10-CM | POA: Insufficient documentation

## 2024-01-26 NOTE — Progress Notes (Signed)
 The patient is a 69 year old female that I am seeing for the first time sent to me from Dr. Jacques Perfect to evaluate and treat known end-stage arthritis of her left hip.  She does ambulate with a cane.  She has had extensive lumbar spine surgery and neck surgery in the past.  She has been dealing with left hip pain for over a year now has been getting worse and has pain in the groin as well.  There are x-rays in the canopy system of her pelvis and left hip.  At this point her left hip pain is daily and it is 10 out of 10.  It is detrimentally affecting her mobility, her quality of life and her actives daily living to the point that she does wish to discuss and proceed with hip replacement surgery.  She is a diabetic but is under much better control than she was years ago with her last hemoglobin A 1C being under 6 just a month ago.  Her BMI is 37.77.  I did review medications with her and past medical history.  On exam her right hip moves smoothly and fluidly but her left hip has significant pain with flexion as well as internal and external rotation with significant stiffness as well.  An AP pelvis and lateral left hip on the canopy system reviewed with her and she does have end-stage bone-on-bone arthritis of the left hip.  The right hip joint space is well-maintained but the left hip has complete loss of superior lateral joint space with bone-on-bone wear and sclerotic changes as well as osteophytes.  It looks like she does have a previous lower lumbar spine instrumented fusion.  We had a long and thorough discussion about hip replacement surgery.  I gave her a handout about hip replacement surgery and went over her x-rays and a hip replacement model.  We discussed the risks and benefits of surgery and what to expect from an intraoperative and postoperative standpoint.  She will continue keeping her blood glucose under control and stay on a weight loss journey.  We work on getting her scheduled for a  left hip replacement in the near future.  All questions and concerns were addressed and answered.

## 2024-01-27 ENCOUNTER — Telehealth: Payer: Self-pay

## 2024-01-27 NOTE — Telephone Encounter (Signed)
 I called patient to discuss scheduling left THA.  Left voice mail message for patient to call me back.

## 2024-02-21 ENCOUNTER — Other Ambulatory Visit: Payer: Self-pay | Admitting: Physician Assistant

## 2024-02-21 DIAGNOSIS — Z01818 Encounter for other preprocedural examination: Secondary | ICD-10-CM

## 2024-02-21 NOTE — Progress Notes (Signed)
 Sent message, via epic in basket, requesting orders in epic from Careers adviser.

## 2024-02-21 NOTE — Progress Notes (Signed)
 Anesthesia Review:  PCP: Spencer Copland LVO 12/28/23  Cardiologist :  PPM/ ICD: Device Orders: Rep Notified:  Chest x-ray : EKG : Echo : 2016  Stress test: Cardiac Cath :   Activity level:  Sleep Study/ CPAP : Fasting Blood Sugar :      / Checks Blood Sugar -- times a day:     DM- type Hgba1c-   Blood Thinner/ Instructions /Last Dose: ASA / Instructions/ Last Dose :

## 2024-02-23 ENCOUNTER — Telehealth (HOSPITAL_BASED_OUTPATIENT_CLINIC_OR_DEPARTMENT_OTHER): Payer: Self-pay | Admitting: *Deleted

## 2024-02-23 ENCOUNTER — Encounter (HOSPITAL_COMMUNITY): Admission: RE | Admit: 2024-02-23 | Discharge: 2024-02-23 | Attending: Orthopaedic Surgery

## 2024-02-23 ENCOUNTER — Encounter (HOSPITAL_COMMUNITY): Payer: Self-pay

## 2024-02-23 ENCOUNTER — Other Ambulatory Visit: Payer: Self-pay

## 2024-02-23 DIAGNOSIS — Z01818 Encounter for other preprocedural examination: Secondary | ICD-10-CM

## 2024-02-23 DIAGNOSIS — E139 Other specified diabetes mellitus without complications: Secondary | ICD-10-CM

## 2024-02-23 LAB — HEMOGLOBIN A1C
Hgb A1c MFr Bld: 5.6 % (ref 4.8–5.6)
Mean Plasma Glucose: 114.02 mg/dL

## 2024-02-23 LAB — BASIC METABOLIC PANEL WITH GFR
Anion gap: 10 (ref 5–15)
BUN: 19 mg/dL (ref 8–23)
CO2: 25 mmol/L (ref 22–32)
Calcium: 9.4 mg/dL (ref 8.9–10.3)
Chloride: 104 mmol/L (ref 98–111)
Creatinine, Ser: 0.6 mg/dL (ref 0.44–1.00)
GFR, Estimated: >60 mL/min
Glucose, Bld: 121 mg/dL — ABNORMAL HIGH (ref 70–99)
Potassium: 4.3 mmol/L (ref 3.5–5.1)
Sodium: 139 mmol/L (ref 135–145)

## 2024-02-23 LAB — CBC
HCT: 41.9 % (ref 36.0–46.0)
Hemoglobin: 14.2 g/dL (ref 12.0–15.0)
MCH: 32 pg (ref 26.0–34.0)
MCHC: 33.9 g/dL (ref 30.0–36.0)
MCV: 94.4 fL (ref 80.0–100.0)
Platelets: 248 10*3/uL (ref 150–400)
RBC: 4.44 MIL/uL (ref 3.87–5.11)
RDW: 12.5 % (ref 11.5–15.5)
WBC: 5.6 10*3/uL (ref 4.0–10.5)
nRBC: 0 % (ref 0.0–0.2)

## 2024-02-23 LAB — TYPE AND SCREEN
ABO/RH(D): O NEG
Antibody Screen: NEGATIVE

## 2024-02-23 LAB — SURGICAL PCR SCREEN
MRSA, PCR: NEGATIVE
Staphylococcus aureus: NEGATIVE

## 2024-02-23 NOTE — Telephone Encounter (Signed)
" ° °  Name: Summer Brown  DOB: [birth year]  MRN: [id number]  Primary Cardiologist: None  Chart reviewed as part of pre-operative protocol coverage. Because of Adisen Bennion Moomaw's past medical history and time since last visit, she will require a follow-up in-office visit in order to better assess preoperative cardiovascular risk.  Pre-op covering staff: - Please schedule appointment and call patient to inform them. If patient already had an upcoming appointment within acceptable timeframe, please add pre-op clearance to the appointment notes so provider is aware. - Please contact requesting surgeon's office via preferred method (i.e, phone, fax) to inform them of need for appointment prior to surgery.  No medications indicated as needing held.   Orren LOISE Fabry, PA-C  02/23/2024, 11:52 AM   "

## 2024-02-23 NOTE — Telephone Encounter (Signed)
"  ° °  Pre-operative Risk Assessment    Patient Name: Summer Brown  DOB: 1955/06/02 MRN: [id number]   Date of last office visit: NONE Date of next office visit: NONE; PT WILL NEED NEW PT APPT; WILL DEFER TO PREOP APP IF PT WILL NEED MD NEW PT APPT OR HEART 1ST NEW PT APPT ; PT BEING REFERRED FOR ABN EKG DURING PREOP AT HOSPITAL    I DID S/W THE SURGERY SCHEDULER SHERRIE THE PT WILL NEED A NEW PT APPT AND NOT SURE THAT WE WILL GE THE PT IN BEFORE 03/02/24 SURGERY DATE, THOUGH WE WILL DO ALL THAT WE CAN TO ACCOMMODATE.  Request for Surgical Clearance    Procedure:  LEFT TOTAL HIP ARTHROPLASTY  Date of Surgery:  Clearance 03/02/24                                Surgeon:  AHMED BRUCKNER [facility] Surgeon's Group or Practice Name:  [facility] Phone number:  [phone] Fax number:  [phone] ATTN: SHERRIE    Type of Clearance Requested:   - Medical    Type of Anesthesia:  Spinal   Additional requests/questions:    Summer Brown   02/23/2024, 11:27 AM    Summer Brown, Summer Brown Good morning,  We received a referral for pre op clearance due to having an abnormal EKG. The referral was placed as urgent. I'm not sure if the referral has all the preop information needed. Would the patient be eligible for the [facility]?  Thanks, Summer Brown  REFERRAL:  Priority Start Date Expiration Date Referral Entered By  Urgent 02/23/2024 02/22/2025 Summer Brown     Visits Requested Visits Authorized Visits Completed Visits Scheduled  1 1     Procedure Information  Service Details Procedure Modifiers Provider Requested Approved  REF12 - AMB REFERRAL TO CARDIOLOGY none  1 1  Free Text Procedure Description   Needs pre-op total hip arthroplasty (planned 03/02/24) clearance, abnormal EKG at pre-op appointment at hospital         "

## 2024-02-23 NOTE — Telephone Encounter (Signed)
 PT BEING REFERRED FOR ABN EKG DURING PREOP AT HOSPITAL

## 2024-02-23 NOTE — Telephone Encounter (Signed)
 HF preop clearance appt already scheduled.

## 2024-02-24 ENCOUNTER — Ambulatory Visit: Admitting: Physician Assistant

## 2024-02-24 NOTE — Telephone Encounter (Signed)
 Will update all parties involved pt has new pt appt with Jon Hails, [facility] 02/24/24.

## 2024-02-24 NOTE — Progress Notes (Unsigned)
 " Cardiology Office Note:    Date:  02/24/2024   ID:  Summer Brown, DOB February 04, 1955, MRN [id number]  PCP:  Watt Mirza, MD   [facility] Providers Cardiologist:  None { Click to update primary MD,subspecialty MD or APP then REFRESH:1}    Referring MD: Vernetta Lonni GRADE, MD   No chief complaint on file. ***  History of Present Illness:    Summer Brown is a 69 y.o. female with a hx of angio-edema and arthritis.   Echo 2016 demonstrated LVEF 60-65% with no RWMA, an no significant valvular disease.   She is preparing for hip surgery and we have been asked for preop risk evaluation.   She ambulates with a cane and has had prior neck and back surgeries. She has no past cardiac history.       Social history: Currently working as   Lives at home with   Physical activity:   Tobacco use:  Alcohol use:  THC containing products: Illicit drugs/IVDU:  Family history: Mother -  Father -      Preoperative risk evaluation for MACE prior to hips surgery.        Past Medical History:  Diagnosis Date   Angio-edema    Arthritis    Chronic back pain 12/12/2013   Osteoporosis    Ulcer     Past Surgical History:  Procedure Laterality Date   BACK SURGERY     CERVICAL SPINE SURGERY     FRACTURE SURGERY     NOSE SURGERY     OVARIAN CYST SURGERY     SPINE SURGERY      Current Medications: Active Medications[1]   Allergies:   Celecoxib , Codeine, Iodine, and Sodium   Social History   Socioeconomic History   Marital status: Single    Spouse name: Not on file   Number of children: Not on file   Years of education: Not on file   Highest education level: Not on file  Occupational History    Employer: OTHER    Comment: [employer]  Tobacco Use   Smoking status: Never    Passive exposure: Never   Smokeless tobacco: Never  Vaping Use   Vaping status: Never Used  Substance and Sexual Activity   Alcohol use: Not Currently   Drug use:  No   Sexual activity: Not Currently  Other Topics Concern   Not on file  Social History Narrative   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   Social Drivers of Health   Tobacco Use: Low Risk (02/23/2024)   Patient History    Smoking Tobacco Use: Never    Smokeless Tobacco Use: Never    Passive Exposure: Never  Financial Resource Strain: Low Risk (09/20/2022)   Overall Financial Resource Strain (CARDIA)    Difficulty of Paying Living Expenses: Not very hard  Food Insecurity: No Food Insecurity (09/21/2022)   Hunger Vital Sign    Worried About Running Out of Food in the Last Year: Never true    Ran Out of Food in the Last Year: Never true  Recent Concern: Food Insecurity - Food Insecurity Present (09/20/2022)   Hunger Vital Sign    Worried About Running Out of Food in the Last Year: Sometimes true    Ran Out of Food in the Last Year: Sometimes true  Transportation Needs: No Transportation Needs (09/20/2022)   PRAPARE - Transportation    Lack of  Transportation (Medical): No    Lack of Transportation (Non-Medical): No  Physical Activity: Inactive (09/20/2022)   Exercise Vital Sign    Days of Exercise per Week: 0 days    Minutes of Exercise per Session: 0 min  Stress: No Stress Concern Present (09/20/2022)   [employer] of Occupational Health - Occupational Stress Questionnaire    Feeling of Stress : Not at all  Social Connections: Moderately Isolated (09/20/2022)   Social Connection and Isolation Panel    Frequency of Communication with Friends and Family: More than three times a week    Frequency of Social Gatherings with Friends and Family: Twice a week    Attends Religious Services: More than 4 times per year    Active Member of Clubs or Organizations: No    Attends Banker Meetings: Never    Marital Status: Divorced  Depression (PHQ2-9): Low Risk (12/28/2023)   Depression (PHQ2-9)    PHQ-2 Score: 0  Alcohol Screen: Low Risk  (09/20/2022)   Alcohol Screen    Last Alcohol Screening Score (AUDIT): 0  Housing: Low Risk (09/20/2022)   Housing    Last Housing Risk Score: 0  Utilities: Not At Risk (09/20/2022)   AHC Utilities    Threatened with loss of utilities: No  Health Literacy: Adequate Health Literacy (09/21/2022)   B1300 Health Literacy    Frequency of need for help with medical instructions: Never     Family History: The patient's ***family history includes Diabetes in her father; Heart attack in her mother. There is no history of Colon cancer, Esophageal cancer, Rectal cancer, Stomach cancer, or Breast cancer.  ROS:   Please see the history of present illness.    *** All other systems reviewed and are negative.  EKGs/Labs/Other Studies Reviewed:    The following studies were reviewed today: ***      Recent Labs: 12/28/2023: ALT 12; TSH 4.25 02/23/2024: BUN 19; Creatinine, Ser 0.60; Hemoglobin 14.2; Platelets 248; Potassium 4.3; Sodium 139  Recent Lipid Panel    Component Value Date/Time   CHOL 168 12/22/2022 1617   TRIG 61.0 12/22/2022 1617   HDL 43.20 12/22/2022 1617   CHOLHDL 4 12/22/2022 1617   VLDL 12.2 12/22/2022 1617   LDLCALC 112 (H) 12/22/2022 1617     Risk Assessment/Calculations:   {Does this patient have ATRIAL FIBRILLATION?:[phone]}            Physical Exam:    VS:  There were no vitals taken for this visit.    Wt Readings from Last 3 Encounters:  01/26/24 234 lb (106.1 kg)  12/28/23 234 lb 4 oz (106.3 kg)  09/24/23 248 lb 7.3 oz (112.7 kg)     GEN: *** Well nourished, well developed in no acute distress HEENT: Normal NECK: No JVD; No carotid bruits LYMPHATICS: No lymphadenopathy CARDIAC: ***RRR, no murmurs, rubs, gallops RESPIRATORY:  Clear to auscultation without rales, wheezing or rhonchi  ABDOMEN: Soft, non-tender, non-distended MUSCULOSKELETAL:  No edema; No deformity  SKIN: Warm and dry NEUROLOGIC:  Alert and oriented x 3 PSYCHIATRIC:  Normal affect    ASSESSMENT:    No diagnosis found. PLAN:    In order of problems listed above:  ***      {Are you ordering a CV Procedure (e.g. stress test, cath, DCCV, TEE, etc)?   Press F2        :[id number]}    Medication Adjustments/Labs and Tests Ordered: Current medicines are reviewed at length with the patient today.  Concerns  regarding medicines are outlined above.  No orders of the defined types were placed in this encounter.  No orders of the defined types were placed in this encounter.   There are no Patient Instructions on file for this visit.   Signed, Jon Nat Hails, [place]  02/24/2024 9:49 AM    [facility]    [1]  No outpatient medications have been marked as taking for the 02/24/24 encounter (Appointment) with Hails Jon Nat, PA.   "

## 2024-02-27 ENCOUNTER — Ambulatory Visit: Admitting: Physician Assistant

## 2024-03-02 ENCOUNTER — Encounter (HOSPITAL_COMMUNITY): Admission: RE | Payer: Self-pay | Source: Home / Self Care

## 2024-03-02 ENCOUNTER — Encounter (HOSPITAL_COMMUNITY): Payer: Self-pay | Admitting: Medical

## 2024-03-02 ENCOUNTER — Ambulatory Visit (HOSPITAL_COMMUNITY): Admission: RE | Admit: 2024-03-02 | Source: Home / Self Care | Admitting: Orthopaedic Surgery

## 2024-03-15 ENCOUNTER — Encounter: Admitting: Orthopaedic Surgery
